# Patient Record
Sex: Male | Born: 1955 | Race: White | Hispanic: No | Smoking: Former smoker
Health system: Southern US, Community
[De-identification: ages and names within clinical notes are randomized; demographics above are authoritative.]

## PROBLEM LIST (undated history)

## (undated) DIAGNOSIS — M1990 Unspecified osteoarthritis, unspecified site: Secondary | ICD-10-CM

## (undated) DIAGNOSIS — F329 Major depressive disorder, single episode, unspecified: Secondary | ICD-10-CM

## (undated) DIAGNOSIS — N400 Enlarged prostate without lower urinary tract symptoms: Secondary | ICD-10-CM

## (undated) DIAGNOSIS — F419 Anxiety disorder, unspecified: Secondary | ICD-10-CM

## (undated) DIAGNOSIS — F32A Depression, unspecified: Secondary | ICD-10-CM

## (undated) DIAGNOSIS — N433 Hydrocele, unspecified: Secondary | ICD-10-CM

## (undated) DIAGNOSIS — F4321 Adjustment disorder with depressed mood: Secondary | ICD-10-CM

## (undated) DIAGNOSIS — I10 Essential (primary) hypertension: Secondary | ICD-10-CM

## (undated) DIAGNOSIS — K5730 Diverticulosis of large intestine without perforation or abscess without bleeding: Secondary | ICD-10-CM

## (undated) HISTORY — DX: Benign prostatic hyperplasia without lower urinary tract symptoms: N40.0

## (undated) HISTORY — DX: Depression, unspecified: F32.A

## (undated) HISTORY — DX: Diverticulosis of large intestine without perforation or abscess without bleeding: K57.30

## (undated) HISTORY — DX: Adjustment disorder with depressed mood: F43.21

## (undated) HISTORY — DX: Major depressive disorder, single episode, unspecified: F32.9

## (undated) HISTORY — DX: Essential (primary) hypertension: I10

## (undated) HISTORY — DX: Anxiety disorder, unspecified: F41.9

---

## 1976-03-18 HISTORY — PX: HERNIA REPAIR: SHX51

## 1999-10-26 ENCOUNTER — Ambulatory Visit (HOSPITAL_COMMUNITY): Admission: RE | Admit: 1999-10-26 | Discharge: 1999-10-26 | Payer: Self-pay | Admitting: Internal Medicine

## 1999-10-26 ENCOUNTER — Encounter: Payer: Self-pay | Admitting: Internal Medicine

## 2001-07-24 ENCOUNTER — Emergency Department (HOSPITAL_COMMUNITY): Admission: EM | Admit: 2001-07-24 | Discharge: 2001-07-24 | Payer: Self-pay | Admitting: Emergency Medicine

## 2001-07-26 ENCOUNTER — Emergency Department (HOSPITAL_COMMUNITY): Admission: EM | Admit: 2001-07-26 | Discharge: 2001-07-26 | Payer: Self-pay

## 2006-05-16 ENCOUNTER — Ambulatory Visit: Payer: Self-pay | Admitting: Internal Medicine

## 2006-05-16 LAB — CONVERTED CEMR LAB
ALT: 22 units/L (ref 0–40)
AST: 20 units/L (ref 0–37)
Albumin: 4.5 g/dL (ref 3.5–5.2)
Alkaline Phosphatase: 70 units/L (ref 39–117)
BUN: 14 mg/dL (ref 6–23)
Basophils Absolute: 0 10*3/uL (ref 0.0–0.1)
Basophils Relative: 0.6 % (ref 0.0–1.0)
Bilirubin Urine: NEGATIVE
Bilirubin, Direct: 0.1 mg/dL (ref 0.0–0.3)
CO2: 26 meq/L (ref 19–32)
Calcium: 9 mg/dL (ref 8.4–10.5)
Chloride: 103 meq/L (ref 96–112)
Cholesterol: 192 mg/dL (ref 0–200)
Creatinine, Ser: 0.6 mg/dL (ref 0.4–1.5)
Eosinophils Absolute: 0 10*3/uL (ref 0.0–0.6)
Eosinophils Relative: 0.6 % (ref 0.0–5.0)
GFR calc Af Amer: 183 mL/min
GFR calc non Af Amer: 152 mL/min
Glucose, Bld: 97 mg/dL (ref 70–99)
HCT: 43.9 % (ref 39.0–52.0)
HDL: 46.1 mg/dL (ref >39.0)
Hemoglobin, Urine: NEGATIVE
Hemoglobin: 15.3 g/dL (ref 13.0–17.0)
Ketones, ur: NEGATIVE mg/dL
LDL Cholesterol: 108 mg/dL — ABNORMAL HIGH (ref 0–99)
Leukocytes, UA: NEGATIVE
Lymphocytes Relative: 23.7 % (ref 12.0–46.0)
MCHC: 34.8 g/dL (ref 30.0–36.0)
MCV: 91.5 fL (ref 78.0–100.0)
Monocytes Absolute: 0.4 10*3/uL (ref 0.2–0.7)
Monocytes Relative: 6.6 % (ref 3.0–11.0)
Neutro Abs: 4.3 10*3/uL (ref 1.4–7.7)
Neutrophils Relative %: 68.5 % (ref 43.0–77.0)
Nitrite: NEGATIVE
PSA: 0.84 ng/mL (ref 0.10–4.00)
Platelets: 243 10*3/uL (ref 150–400)
Potassium: 3.9 meq/L (ref 3.5–5.1)
RBC: 4.79 M/uL (ref 4.22–5.81)
RDW: 11.6 % (ref 11.5–14.6)
Sodium: 134 meq/L — ABNORMAL LOW (ref 135–145)
Specific Gravity, Urine: 1.015 (ref 1.000–1.03)
TSH: 1.43 microintl units/mL (ref 0.35–5.50)
Total Bilirubin: 0.7 mg/dL (ref 0.3–1.2)
Total CHOL/HDL Ratio: 4.2
Total Protein, Urine: NEGATIVE mg/dL
Total Protein: 6.7 g/dL (ref 6.0–8.3)
Triglycerides: 187 mg/dL — ABNORMAL HIGH (ref 0–149)
Urine Glucose: NEGATIVE mg/dL
Urobilinogen, UA: 0.2 (ref 0.0–1.0)
VLDL: 37 mg/dL (ref 0–40)
WBC: 6.1 10*3/uL (ref 4.5–10.5)
pH: 7 (ref 5.0–8.0)

## 2006-05-20 ENCOUNTER — Ambulatory Visit: Payer: Self-pay | Admitting: Internal Medicine

## 2006-06-27 ENCOUNTER — Ambulatory Visit: Payer: Self-pay | Admitting: Gastroenterology

## 2006-07-09 ENCOUNTER — Ambulatory Visit: Payer: Self-pay | Admitting: Gastroenterology

## 2006-07-09 LAB — HM COLONOSCOPY

## 2006-11-27 ENCOUNTER — Encounter: Payer: Self-pay | Admitting: Internal Medicine

## 2007-04-09 ENCOUNTER — Emergency Department (HOSPITAL_COMMUNITY): Admission: EM | Admit: 2007-04-09 | Discharge: 2007-04-09 | Payer: Self-pay | Admitting: Emergency Medicine

## 2009-03-06 ENCOUNTER — Ambulatory Visit: Payer: Self-pay | Admitting: Internal Medicine

## 2009-03-06 DIAGNOSIS — R0789 Other chest pain: Secondary | ICD-10-CM | POA: Insufficient documentation

## 2009-03-06 DIAGNOSIS — K5730 Diverticulosis of large intestine without perforation or abscess without bleeding: Secondary | ICD-10-CM | POA: Insufficient documentation

## 2009-03-06 DIAGNOSIS — M722 Plantar fascial fibromatosis: Secondary | ICD-10-CM | POA: Insufficient documentation

## 2009-03-06 DIAGNOSIS — Z87891 Personal history of nicotine dependence: Secondary | ICD-10-CM | POA: Insufficient documentation

## 2009-03-14 ENCOUNTER — Telehealth (INDEPENDENT_AMBULATORY_CARE_PROVIDER_SITE_OTHER): Payer: Self-pay | Admitting: *Deleted

## 2009-03-15 ENCOUNTER — Ambulatory Visit: Payer: Self-pay | Admitting: Cardiovascular Disease

## 2009-03-15 ENCOUNTER — Ambulatory Visit: Payer: Self-pay

## 2009-03-15 ENCOUNTER — Encounter (HOSPITAL_COMMUNITY): Admission: RE | Admit: 2009-03-15 | Discharge: 2009-05-22 | Payer: Self-pay | Admitting: Internal Medicine

## 2009-03-18 DIAGNOSIS — I10 Essential (primary) hypertension: Secondary | ICD-10-CM

## 2009-03-18 HISTORY — DX: Essential (primary) hypertension: I10

## 2009-05-02 ENCOUNTER — Ambulatory Visit: Payer: Self-pay | Admitting: Internal Medicine

## 2009-05-02 DIAGNOSIS — N410 Acute prostatitis: Secondary | ICD-10-CM | POA: Insufficient documentation

## 2009-05-02 DIAGNOSIS — R109 Unspecified abdominal pain: Secondary | ICD-10-CM | POA: Insufficient documentation

## 2009-05-02 LAB — CONVERTED CEMR LAB
ALT: 28 units/L (ref 0–53)
AST: 23 units/L (ref 0–37)
Albumin: 4.9 g/dL (ref 3.5–5.2)
Alkaline Phosphatase: 66 units/L (ref 39–117)
BUN: 10 mg/dL (ref 6–23)
Basophils Absolute: 0 10*3/uL (ref 0.0–0.1)
Basophils Relative: 0.8 % (ref 0.0–3.0)
Bilirubin Urine: NEGATIVE
Bilirubin, Direct: 0.2 mg/dL (ref 0.0–0.3)
CO2: 29 meq/L (ref 19–32)
Calcium: 9.7 mg/dL (ref 8.4–10.5)
Chloride: 106 meq/L (ref 96–112)
Creatinine, Ser: 0.8 mg/dL (ref 0.4–1.5)
Eosinophils Absolute: 0 10*3/uL (ref 0.0–0.7)
Eosinophils Relative: 0.8 % (ref 0.0–5.0)
GFR calc non Af Amer: 107.45 mL/min (ref >60)
Glucose, Bld: 101 mg/dL — ABNORMAL HIGH (ref 70–99)
HCT: 45.2 % (ref 39.0–52.0)
Hemoglobin, Urine: NEGATIVE
Hemoglobin: 15.5 g/dL (ref 13.0–17.0)
Ketones, ur: NEGATIVE mg/dL
Leukocytes, UA: NEGATIVE
Lymphocytes Relative: 22.1 % (ref 12.0–46.0)
Lymphs Abs: 1.1 10*3/uL (ref 0.7–4.0)
MCHC: 34.2 g/dL (ref 30.0–36.0)
MCV: 94.4 fL (ref 78.0–100.0)
Monocytes Absolute: 0.4 10*3/uL (ref 0.1–1.0)
Monocytes Relative: 7 % (ref 3.0–12.0)
Neutro Abs: 3.6 10*3/uL (ref 1.4–7.7)
Neutrophils Relative %: 69.3 % (ref 43.0–77.0)
Nitrite: NEGATIVE
PSA: 1.07 ng/mL (ref 0.10–4.00)
Platelets: 225 10*3/uL (ref 150.0–400.0)
Potassium: 4.4 meq/L (ref 3.5–5.1)
RBC: 4.79 M/uL (ref 4.22–5.81)
RDW: 11.7 % (ref 11.5–14.6)
Sodium: 140 meq/L (ref 135–145)
Specific Gravity, Urine: 1.01 (ref 1.000–1.030)
Total Bilirubin: 0.6 mg/dL (ref 0.3–1.2)
Total Protein, Urine: NEGATIVE mg/dL
Total Protein: 7.4 g/dL (ref 6.0–8.3)
Urine Glucose: NEGATIVE mg/dL
Urobilinogen, UA: 0.2 (ref 0.0–1.0)
WBC: 5.1 10*3/uL (ref 4.5–10.5)
pH: 7 (ref 5.0–8.0)

## 2009-05-05 ENCOUNTER — Telehealth: Payer: Self-pay | Admitting: Internal Medicine

## 2009-05-23 ENCOUNTER — Ambulatory Visit: Payer: Self-pay | Admitting: Internal Medicine

## 2009-05-23 DIAGNOSIS — F411 Generalized anxiety disorder: Secondary | ICD-10-CM | POA: Insufficient documentation

## 2009-05-23 DIAGNOSIS — F329 Major depressive disorder, single episode, unspecified: Secondary | ICD-10-CM | POA: Insufficient documentation

## 2009-06-02 ENCOUNTER — Ambulatory Visit: Payer: Self-pay | Admitting: Internal Medicine

## 2009-06-05 ENCOUNTER — Telehealth (INDEPENDENT_AMBULATORY_CARE_PROVIDER_SITE_OTHER): Payer: Self-pay | Admitting: *Deleted

## 2009-06-09 ENCOUNTER — Ambulatory Visit: Payer: Self-pay | Admitting: Psychology

## 2009-06-12 ENCOUNTER — Ambulatory Visit: Payer: Self-pay | Admitting: Internal Medicine

## 2009-06-15 ENCOUNTER — Ambulatory Visit: Payer: Self-pay | Admitting: Psychology

## 2009-06-29 ENCOUNTER — Ambulatory Visit: Payer: Self-pay | Admitting: Psychology

## 2009-07-24 ENCOUNTER — Ambulatory Visit: Payer: Self-pay | Admitting: Psychology

## 2009-08-28 ENCOUNTER — Telehealth: Payer: Self-pay | Admitting: Internal Medicine

## 2009-10-24 ENCOUNTER — Telehealth: Payer: Self-pay | Admitting: Internal Medicine

## 2009-10-31 ENCOUNTER — Telehealth (INDEPENDENT_AMBULATORY_CARE_PROVIDER_SITE_OTHER): Payer: Self-pay | Admitting: *Deleted

## 2009-11-14 ENCOUNTER — Ambulatory Visit: Payer: Self-pay | Admitting: Internal Medicine

## 2009-11-14 DIAGNOSIS — G4700 Insomnia, unspecified: Secondary | ICD-10-CM | POA: Insufficient documentation

## 2009-11-29 ENCOUNTER — Telehealth: Payer: Self-pay | Admitting: Internal Medicine

## 2009-11-30 ENCOUNTER — Ambulatory Visit: Payer: Self-pay | Admitting: Internal Medicine

## 2009-11-30 DIAGNOSIS — I10 Essential (primary) hypertension: Secondary | ICD-10-CM | POA: Insufficient documentation

## 2009-12-20 ENCOUNTER — Ambulatory Visit: Payer: Self-pay | Admitting: Internal Medicine

## 2009-12-25 ENCOUNTER — Ambulatory Visit: Payer: Self-pay | Admitting: Psychology

## 2009-12-29 ENCOUNTER — Ambulatory Visit: Payer: Self-pay | Admitting: Internal Medicine

## 2010-01-04 ENCOUNTER — Telehealth: Payer: Self-pay | Admitting: Internal Medicine

## 2010-01-08 ENCOUNTER — Ambulatory Visit: Payer: Self-pay | Admitting: Internal Medicine

## 2010-01-11 ENCOUNTER — Ambulatory Visit: Payer: Self-pay | Admitting: Psychology

## 2010-01-15 ENCOUNTER — Telehealth: Payer: Self-pay | Admitting: Internal Medicine

## 2010-01-16 ENCOUNTER — Ambulatory Visit: Payer: Self-pay | Admitting: Internal Medicine

## 2010-01-23 ENCOUNTER — Telehealth: Payer: Self-pay | Admitting: Internal Medicine

## 2010-01-23 ENCOUNTER — Telehealth (INDEPENDENT_AMBULATORY_CARE_PROVIDER_SITE_OTHER): Payer: Self-pay | Admitting: *Deleted

## 2010-01-25 ENCOUNTER — Ambulatory Visit: Payer: Self-pay | Admitting: Internal Medicine

## 2010-01-29 ENCOUNTER — Telehealth (INDEPENDENT_AMBULATORY_CARE_PROVIDER_SITE_OTHER): Payer: Self-pay | Admitting: *Deleted

## 2010-02-06 ENCOUNTER — Ambulatory Visit: Payer: Self-pay | Admitting: Psychology

## 2010-02-07 ENCOUNTER — Ambulatory Visit: Payer: Self-pay | Admitting: Internal Medicine

## 2010-02-07 ENCOUNTER — Ambulatory Visit: Payer: Self-pay | Admitting: Psychology

## 2010-02-12 ENCOUNTER — Telehealth: Payer: Self-pay | Admitting: Internal Medicine

## 2010-02-13 ENCOUNTER — Ambulatory Visit: Payer: Self-pay | Admitting: Psychology

## 2010-02-15 ENCOUNTER — Ambulatory Visit: Payer: Self-pay | Admitting: Internal Medicine

## 2010-02-16 ENCOUNTER — Encounter: Payer: Self-pay | Admitting: Internal Medicine

## 2010-03-01 ENCOUNTER — Ambulatory Visit: Payer: Self-pay | Admitting: Psychology

## 2010-03-02 ENCOUNTER — Ambulatory Visit: Payer: Self-pay | Admitting: Internal Medicine

## 2010-03-16 ENCOUNTER — Ambulatory Visit: Payer: Self-pay | Admitting: Internal Medicine

## 2010-04-19 NOTE — Assessment & Plan Note (Signed)
Summary: 4 MO ROV /NWS  #   Vital Signs:  Patient profile:   55 year old male Height:      69 inches Weight:      207 pounds BMI:     30.68 Temp:     97.9 degrees F oral Pulse rate:   80 / minute Pulse rhythm:   regular Resp:     16 per minute BP sitting:   122 / 90  (left arm) Cuff size:   regular  Vitals Entered By: Lanier Prude, CMA(AAMA) (March 16, 2010 9:03 AM) CC: 4 mo f/u  c/o cough X 3 wks since starting Clonazepam Is Patient Diabetic? No Comments pt is not taking Lorazepam   Primary Care Provider:  Georgina Quint Plotnikov MD  CC:  4 mo f/u  c/o cough X 3 wks since starting Clonazepam.  History of Present Illness: The patient presents for a follow up of stress, anxiety, depression and HTN. He is beeing interviewed and having background check with another company.  Current Medications (verified): 1)  Bayer Low Strength 81 Mg Tbec (Aspirin) .... Take 1 By Mouth Qd 2)  Vitamin C 1000 Mg Tabs (Ascorbic Acid) .... Take 1 By Mouth Qd 3)  Fish Oil 1000 Mg Caps (Omega-3 Fatty Acids) .... Take 1 By Mouth Qd 4)  Lorazepam 0.5 Mg Tabs (Lorazepam) .Marland Kitchen.. 1-2 By Mouth Two Times A Day As Needed For Anxiety or Insmnia 5)  Trazodone Hcl 50 Mg Tabs (Trazodone Hcl) .Marland Kitchen.. 1 By Mouth Qhs 6)  Losartan Potassium 100 Mg Tabs (Losartan Potassium) .Marland Kitchen.. 1 By Mouth Once Daily For Blood Pressure 7)  Paroxetine Hcl 20 Mg Tabs (Paroxetine Hcl) .... 2 By Mouth Qd 8)  Multivitamins  Tabs (Multiple Vitamin) .... Take 1 By Mouth Qd 9)  Norvasc 5 Mg Tabs (Amlodipine Besylate) .Marland Kitchen.. 1 By Mouth Once Daily 10)  Clonazepam 0.5 Mg Tabs (Clonazepam) .Marland Kitchen.. 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    Physical Exam  General:  Tense , alert, well-developed, well-nourished, and cooperative to examination.    Head:  Normocephalic and atraumatic without obvious abnormalities. No apparent alopecia or balding. Nose:  External nasal examination shows no deformity or inflammation. Nasal mucosa are pink and moist without lesions or exudates. Mouth:  Oral mucosa and oropharynx without lesions or exudates.  Teeth in good repair. Neck:  No deformities, masses, or tenderness noted. Lungs:  Normal respiratory effort, chest expands symmetrically. Lungs are clear to auscultation, no crackles or wheezes. Heart:  Normal rate and regular rhythm. S1 and S2 normal without gallop, murmur, click, rub or other extra sounds. Abdomen:  soft, non-tender, normal bowel sounds, no distention, no masses, no guarding, no rigidity, no rebound tenderness, no abdominal hernia, no inguinal hernia, no hepatomegaly, and no splenomegaly.   Msk:  No deformity or scoliosis noted of thoracic or lumbar spine.   Neurologic:  No cranial nerve deficits noted. Station and gait are normal. Plantar reflexes are down-going bilaterally. DTRs are symmetrical throughout. Sensory, motor and  coordinative functions appear intact. Skin:  Intact without suspicious lesions or rashes Cervical Nodes:  No lymphadenopathy noted Psych:  not suicidal, not homicidal, less dysphoric affect, depressed affect,  anxious.     Impression & Recommendations:  Problem # 1:  DEPRESSION (ICD-311) Assessment Improved He will see his psychiatrist next Wed and he will release him to work if appropriate ([employer] return to work date - next Thursday). His updated medication list for this problem includes:    Lorazepam 0.5 Mg Tabs (Lorazepam) .Marland Kitchen... 1-2 by mouth two times  a day as needed for anxiety or insmnia    Trazodone Hcl 50 Mg Tabs (Trazodone hcl) .Marland Kitchen... 1 by mouth qhs    Paroxetine Hcl 20 Mg Tabs (Paroxetine hcl) .Marland Kitchen... 2 by mouth qd    Clonazepam 0.5 Mg Tabs (Clonazepam) .Marland Kitchen... 1 by mouth every morning and 2 by mouth at bedtime  Problem # 2:  INSOMNIA, CHRONIC (ICD-307.42) Assessment: Improved On the regimen of medicine(s) reflected in the chart    Problem # 3:  ANXIETY (ICD-300.00) Assessment: Improved  His updated medication list for this problem includes:    Lorazepam 0.5 Mg Tabs (Lorazepam) .Marland Kitchen... 1-2 by mouth two times a day as needed for anxiety or insmnia    Trazodone Hcl 50 Mg Tabs (Trazodone hcl) .Marland Kitchen... 1 by mouth qhs    Paroxetine Hcl 20 Mg Tabs (Paroxetine hcl) .Marland Kitchen... 2 by mouth qd    Clonazepam 0.5 Mg Tabs (Clonazepam) .Marland Kitchen... 1 by mouth every morning and 2 by mouth at bedtime  Problem # 4:  HYPERTENSION (ICD-401.9) Assessment: Unchanged  His updated medication list for this problem includes:    Losartan Potassium 100 Mg Tabs (Losartan potassium) .Marland Kitchen... 1 by mouth once daily for blood pressure    Norvasc 5 Mg Tabs (Amlodipine besylate) .Marland Kitchen... 1 by mouth once daily  BP today: 122/90 Prior BP: 122/82 (02/15/2010)  Labs Reviewed: K+: 4.4 (05/02/2009) Creat: : 0.8 (05/02/2009)   Chol: 192 (05/16/2006)   HDL: 46.1 (05/16/2006)   LDL: 108 (05/16/2006)   TG: 187 (05/16/2006)  Complete Medication List: 1)  Bayer Low Strength 81 Mg Tbec (Aspirin) .... Take 1 by mouth qd 2)  Vitamin C 1000 Mg Tabs (Ascorbic acid) .... Take 1 by mouth qd 3)  Fish Oil 1000 Mg Caps (Omega-3 fatty acids) .... Take 1 by mouth qd 4)  Lorazepam 0.5 Mg Tabs (Lorazepam) .Marland Kitchen.. 1-2 by mouth two times a day as needed for anxiety or insmnia 5)  Trazodone Hcl 50 Mg Tabs (Trazodone hcl) .Marland Kitchen.. 1 by mouth qhs 6)  Losartan Potassium 100 Mg Tabs (Losartan potassium) .Marland Kitchen.. 1 by mouth once daily for blood pressure 7)  Paroxetine Hcl 20 Mg Tabs (Paroxetine hcl) .... 2 by mouth qd 8)   Multivitamins Tabs (Multiple vitamin) .... Take 1 by mouth qd 9)  Norvasc 5 Mg Tabs (Amlodipine besylate) .Marland Kitchen.. 1 by mouth once daily 10)  Clonazepam 0.5 Mg Tabs (Clonazepam) .Marland Kitchen.. 1 by mouth every morning and 2 by mouth at bedtime  Patient Instructions: 1)  Please schedule a follow-up appointment in 3 months.   Orders Added: 1)  Est. Patient Level IV [[address]]

## 2010-04-19 NOTE — Assessment & Plan Note (Signed)
Summary: elevated bp/SD   Vital Signs:  Patient profile:   55 year old male Height:      69 inches Weight:      191 pounds BMI:     28.31 Temp:     97.7 degrees F oral Pulse rate:   84 / minute Pulse rhythm:   regular Resp:     16 per minute BP sitting:   160 / 108  (left arm) Cuff size:   regular  Vitals Entered By: Lanier Prude, CMA(AAMA) (November 30, 2009 8:29 AM) CC: elevated blood pressure Is Patient Diabetic? No   Primary Care Provider:  Georgina Quint Plotnikov MD  CC:  elevated blood pressure.  History of Present Illness: On 9/14 pt woke up and didn't feel quite right, no specific complaints. BP 175/130 per home machine. Later this afternoon BP was 155/110. Again, so specific complaints. He is scheduled for office visit for eval. He put new batteries in BP machine and rechecked: BP was 180/104 HR 107.    Current Medications (verified): 1)  Bayer Low Strength 81 Mg Tbec (Aspirin) .... Take 1 By Mouth Qd 2)  Vitamin C 1000 Mg Tabs (Ascorbic Acid) .... Take 1 By Mouth Qd 3)  Fish Oil 1000 Mg Caps (Omega-3 Fatty Acids) .... Take 1 By Mouth Qd 4)  Multivitamins  Tabs (Multiple Vitamin) .... Take 1 By Mouth Qd 5)  Lorazepam 0.5 Mg Tabs (Lorazepam) .Marland Kitchen.. 1 By Mouth Two Times A Day As Needed For Anxiety or Insmnia 6)  Paroxetine Hcl 10 Mg Tabs (Paroxetine Hcl) .Marland Kitchen.. 1 By Mouth Qd 7)  Trazodone Hcl 50 Mg Tabs (Trazodone Hcl) .Marland Kitchen.. 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    Physical Exam  General:  Tense ,alert, well-developed, well-nourished, and cooperative to examination.    Nose:  External nasal examination shows no deformity or inflammation. Nasal mucosa are pink and moist without lesions or exudates. Mouth:  Oral mucosa and oropharynx without lesions or exudates.  Teeth in good repair. Neck:  No deformities, masses, or tenderness noted. Lungs:  Normal respiratory effort, chest expands symmetrically. Lungs are clear to auscultation, no crackles or wheezes. Heart:  Normal rate and regular rhythm. S1 and S2 normal without gallop, murmur, click, rub or other extra sounds. Abdomen:  soft, non-tender, normal bowel sounds, no distention, no masses, no guarding, no rigidity, no rebound tenderness, no abdominal hernia, no inguinal hernia, no hepatomegaly, and no splenomegaly.   Msk:  No deformity or scoliosis noted of thoracic or lumbar spine.   Neurologic:  No cranial nerve deficits noted. Station and gait are normal. Plantar reflexes are down-going bilaterally. DTRs are symmetrical throughout. Sensory, motor and coordinative functions  appear intact. Skin:  Intact without suspicious lesions or rashes Psych:  not suicidal, not homicidal, dysphoric affect, less depressed affect, and not anxious.     Impression & Recommendations:  Problem # 1:  HYPERTENSION (ICD-401.9) Assessment New  His updated medication list for this problem includes:    Losartan Potassium 100 Mg Tabs (Losartan potassium) .Marland Kitchen... 1 by mouth once daily for blood pressure  Problem # 2:  DEPRESSION  (ICD-311) Assessment: Unchanged Overall better... His updated medication list for this problem includes:    Lorazepam 0.5 Mg Tabs (Lorazepam) .Marland Kitchen... 1 by mouth two times a day as needed for anxiety or insmnia    Paroxetine Hcl 10 Mg Tabs (Paroxetine hcl) .Marland Kitchen... 1 by mouth qd    Trazodone Hcl 50 Mg Tabs (Trazodone hcl) .Marland Kitchen... 1 by mouth qhs  Complete Medication List: 1)  Bayer Low Strength 81 Mg Tbec (Aspirin) .... Take 1 by mouth qd 2)  Vitamin C 1000 Mg Tabs (Ascorbic acid) .... Take 1 by mouth qd 3)  Fish Oil 1000 Mg Caps (Omega-3 fatty acids) .... Take 1 by mouth qd 4)  Multivitamins Tabs (Multiple vitamin) .... Take 1 by mouth qd 5)  Lorazepam 0.5 Mg Tabs (Lorazepam) .Marland Kitchen.. 1 by mouth two times a day as needed for anxiety or insmnia 6)  Paroxetine Hcl 10 Mg Tabs (Paroxetine hcl) .Marland Kitchen.. 1 by mouth qd 7)  Trazodone Hcl 50 Mg Tabs (Trazodone hcl) .Marland Kitchen.. 1 by mouth qhs 8)  Losartan Potassium 100 Mg Tabs (Losartan potassium) .Marland Kitchen.. 1 by mouth once daily for blood pressure  Other Orders: Admin 1st Vaccine ([address]) Flu Vaccine 11yrs + ([address])  Patient Instructions: 1)  Keep return office visit  Prescriptions: LOSARTAN POTASSIUM 100 MG TABS (LOSARTAN POTASSIUM) 1 by mouth once daily for blood pressure  #30 x 12   Entered and Authorized by:   Tresa Garter MD   Signed by:   Tresa Garter MD on 11/30/2009   Method used:   Electronically to        [facility]  [address] [id number] ([facility]ail[facility]       [address]       [place], [place]  [address]       Ph: [phone] or [phone]       Fax: [phone]   RxID:   [id number] LOSARTAN POTASSIUM 100 MG TABS (LOSARTAN POTASSIUM) 1 by mouth once daily for blood pressure  #30 x 12   Entered and Authorized by:   Tresa Garter MD   Signed by:   Tresa Garter MD on 11/30/2009   Method used:   Print then Give to Patient   RxID:   [id number]   .lbflu   Flu Vaccine Consent Questions     Do you have a history  of severe allergic reactions to this vaccine? no    Any prior history of allergic reactions to egg and/or gelatin? no    Do you have a sensitivity to the preservative Thimersol? no    Do you have a past history of Guillan-Barre Syndrome? no    Do you currently have an acute febrile illness? no    Have you ever had a severe reaction to latex? no    Vaccine information given and explained to patient? yes    Are you currently pregnant? no    Lot Number:[id number]   Exp Date:09/15/2010   Site Given  Left Deltoid IM Lanier Prude, [facility])  November 30, 2009 8:54 AM

## 2010-04-19 NOTE — Letter (Signed)
Summary: Results Follow-up Letter  [facility]  [address] [place], [place] [address]   Phone: [phone]  Fax: [phone]    05/02/2009  [address] [place], [place]  [address]  Dear Mr. Verner,   The following are the results of your recent test(s):  Test     Result     Liver/kidney   normal CBC       normal Prostate     normal Urine       normal  _________________________________________________________  Please call for an appointment as directed _________________________________________________________ _________________________________________________________ _________________________________________________________  Sincerely,  Sanda Linger MD [facility]

## 2010-04-19 NOTE — Assessment & Plan Note (Signed)
Summary: LOWER COLON PAIN AND PRESSURE/NWS   Vital Signs:  Patient profile:   55 year old male Height:      69 inches Weight:      188 pounds O2 Sat:      96 % on Room air Temp:     98.4 degrees F oral Pulse rate:   73 / minute Pulse rhythm:   regular Resp:     16 per minute BP sitting:   142 / 88  (left arm) Cuff size:   large  Vitals Entered By: Rock Nephew CMA (May 02, 2009 9:53 AM)  O2 Flow:  Room air CC: colon pain x 04/29/09, pain has progressed and think hernia Is Patient Diabetic? No   Primary Care Provider:  Georgina Quint Plotnikov MD  CC:  colon pain x 04/29/09 and pain has progressed and think hernia.  History of Present Illness: New to me he complains of a 4 day hx. of perineal pain.  Preventive Screening-Counseling & Management  Alcohol-Tobacco     Alcohol drinks/day: 0     Smoking Status: quit     Year Quit: 1999  Caffeine-Diet-Exercise     Does Patient Exercise: yes  Hep-HIV-STD-Contraception     Hepatitis Risk: no risk noted     HIV Risk: no risk noted     STD Risk: no risk noted      Sexual History:  currently monogamous.        Drug Use:  no.        Blood Transfusions:  no.    Medications Prior to Update: 1)  Ec-Naprosyn 500 Mg  Tbec (Naproxen) .... Once Daily 2)  Bayer Low Strength 81 Mg Tbec (Aspirin) .... Take 1 By Mouth Qd 3)  Vitamin C 1000 Mg Tabs (Ascorbic Acid) .... Take 1 By Mouth Qd 4)  Fish Oil 1000 Mg Caps (Omega-3 Fatty Acids) .... Take 1 By Mouth Qd 5)  Multivitamins  Tabs (Multiple Vitamin) .... Take 1 By Mouth Qd  Current Medications (verified): 1)  Ec-Naprosyn 500 Mg  Tbec (Naproxen) .... Once Daily 2)  Bayer Low Strength 81 Mg Tbec (Aspirin) .... Take 1 By Mouth Qd 3)  Vitamin C 1000 Mg Tabs (Ascorbic Acid) .... Take 1 By Mouth Qd 4)  Fish Oil 1000 Mg Caps (Omega-3 Fatty Acids) .... Take 1 By Mouth Qd 5)  Multivitamins  Tabs (Multiple Vitamin) .... Take 1 By Mouth Qd  Allergies (verified): No Known Drug  Allergies  Past History:  Past Medical History: Reviewed history from 03/06/2009 and no changes required. Diverticulosis, colon  Past Surgical History: Denies surgical history  Family History: Reviewed history from 03/06/2009 and no changes required.  father had coronary disease ?CABG - expired age 59y no premature death <50y in relatives  Social History: Reviewed history from 03/06/2009 and no changes required. Former Smoker - quit 1990s min second hand smoke exposure (wife) married, lives with wife Alcohol use-no Drug use-no Regular exercise-yes Hepatitis Risk:  no risk noted HIV Risk:  no risk noted STD Risk:  no risk noted Sexual History:  currently monogamous Blood Transfusions:  no Drug Use:  no Does Patient Exercise:  yes  Review of Systems  The patient denies anorexia, fever, chest pain, melena, hematochezia, severe indigestion/heartburn, hematuria, enlarged lymph nodes, and testicular masses.   General:  Denies chills, fatigue, fever, loss of appetite, malaise, sweats, and weakness. GI:  Denies abdominal pain, bloody stools, change in bowel habits, constipation, dark tarry stools, diarrhea, indigestion, loss of  appetite, nausea, vomiting, and vomiting blood. GU:  Denies discharge, dysuria, genital sores, hematuria, incontinence, nocturia, urinary frequency, and urinary hesitancy.  Physical Exam  General:  alert, well-developed, well-nourished, and cooperative to examination.    Head:  normocephalic, atraumatic, no abnormalities observed, and no abnormalities palpated.   Mouth:  Oral mucosa and oropharynx without lesions or exudates.  Teeth in good repair. Neck:  supple, full ROM, no masses, no thyromegaly, no JVD, normal carotid upstroke, and no carotid bruits.   Lungs:  Normal respiratory effort, chest expands symmetrically. Lungs are clear to auscultation, no crackles or wheezes. Heart:  Normal rate and regular rhythm. S1 and S2 normal without gallop, murmur,  click, rub or other extra sounds. Abdomen:  soft, non-tender, normal bowel sounds, no distention, no masses, no guarding, no rigidity, no rebound tenderness, no abdominal hernia, no inguinal hernia, no hepatomegaly, and no splenomegaly.   Rectal:  No external abnormalities noted. Normal sphincter tone. No rectal masses or tenderness. heme negative stool. Genitalia:  circumcised, no hydrocele, no varicocele, no scrotal masses, no cutaneous lesions, no urethral discharge, and L testes atrophic.   Prostate:  boggy, 2+ enlarged, and L asymmetrical enlargement.   Msk:  No deformity or scoliosis noted of thoracic or lumbar spine.   Pulses:  R and L carotid,radial,femoral,dorsalis pedis and posterior tibial pulses are full and equal bilaterally Extremities:  No clubbing, cyanosis, edema, or deformity noted with normal full range of motion of all joints.   Neurologic:  No cranial nerve deficits noted. Station and gait are normal. Plantar reflexes are down-going bilaterally. DTRs are symmetrical throughout. Sensory, motor and coordinative functions appear intact. Skin:  Intact without suspicious lesions or rashes Cervical Nodes:  No lymphadenopathy noted Axillary Nodes:  No palpable lymphadenopathy Inguinal Nodes:  No significant adenopathy Psych:  Cognition and judgment appear intact. Alert and cooperative with normal attention span and concentration. No apparent delusions, illusions, hallucinations   Impression & Recommendations:  Problem # 1:  PROSTATITIS, ACUTE (ICD-601.0) Assessment New start bactrim Orders: Venipuncture (28413) TLB-BMP (Basic Metabolic Panel-BMET) (80048-METABOL) TLB-CBC Platelet - w/Differential (85025-CBCD) TLB-Hepatic/Liver Function Pnl (80076-HEPATIC) TLB-PSA (Prostate Specific Antigen) (84153-PSA) TLB-Udip w/ Micro (81001-URINE)  Problem # 2:  PELVIC PAIN, ACUTE (ICD-789.09) Assessment: New I think this is due to the prostatitis but will look for other systemic  causes. His updated medication list for this problem includes:    Ec-naprosyn 500 Mg Tbec (Naproxen) ..... Once daily    Bayer Low Strength 81 Mg Tbec (Aspirin) .Marland Kitchen... Take 1 by mouth qd  Orders: Venipuncture (24401) TLB-BMP (Basic Metabolic Panel-BMET) (80048-METABOL) TLB-CBC Platelet - w/Differential (85025-CBCD) TLB-Hepatic/Liver Function Pnl (80076-HEPATIC) TLB-PSA (Prostate Specific Antigen) (84153-PSA) TLB-Udip w/ Micro (81001-URINE) Hemoccult Guaiac-1 spec.(in office) (82270)  Discussed use of medications, application of heat or cold, and exercises.   Problem # 3:  DIVERTICULOSIS, COLON (ICD-562.10) Assessment: Improved  Orders: Hemoccult Guaiac-1 spec.(in office) (02725)  Colonoscopy:  Labs Reviewed: Hgb: 15.3 (05/16/2006)   Hct: 43.9 (05/16/2006)   WBC: 6.1 (05/16/2006)  Complete Medication List: 1)  Ec-naprosyn 500 Mg Tbec (Naproxen) .... Once daily 2)  Bayer Low Strength 81 Mg Tbec (Aspirin) .... Take 1 by mouth qd 3)  Vitamin C 1000 Mg Tabs (Ascorbic acid) .... Take 1 by mouth qd 4)  Fish Oil 1000 Mg Caps (Omega-3 fatty acids) .... Take 1 by mouth qd 5)  Multivitamins Tabs (Multiple vitamin) .... Take 1 by mouth qd 6)  Sulfamethoxazole-tmp Ds 800-160 Mg Tab (Trimethoprim-sulfamethoxazole) .... Take 1 tablet by mouth  morning and night  Patient Instructions: 1)  Please schedule a follow-up appointment in 2 weeks. 2)  Take your antibiotic as prescribed until ALL of it is gone, but stop if you develop a rash or swelling and contact our office as soon as possible. Prescriptions: SULFAMETHOXAZOLE-TMP DS 800-160 MG TAB (TRIMETHOPRIM-SULFAMETHOXAZOLE) Take 1 tablet by mouth morning and night  #60 x 1   Entered and Authorized by:   Etta Grandchild MD   Signed by:   Etta Grandchild MD on 05/02/2009   Method used:   [facility]  [address] [id number]       [address]       [place], [place]  [address]       Ph: [phone]  or [phone]       Fax: [phone]   RxID:   [id number]

## 2010-04-19 NOTE — Progress Notes (Signed)
----   Converted from flag ---- ---- 10/25/2009 10:09 AM, Verdell Face wrote: left message on vm  to cb to sched appt/cd    ---- 10/25/2009 9:59 AM, Lanier Prude, CMA(AAMA) wrote: Please sched pt for OV per AVP.   Thanks!!!! ------------------------------ Jovita Gamma pt appt:  phone---11/14/09 @ 930a w/Dr Plotnikov

## 2010-04-19 NOTE — Progress Notes (Signed)
Summary: RX QUESTION  Phone Note Call from Patient Call back at [phone]   Summary of Call: Pt called req a call back regarding s/e of medication. Called pt's home and spoke with wife. She says pt c/o cough x 2 days. I called pt on cell # provided by wife and left vm to return call.  Initial call taken by: Lamar Sprinkles, CMA,  May 05, 2009 10:04 AM  Follow-up for Phone Call        Bactrim should not have caused a cough. Pls check w/Dr Yetta Barre - he saw him on 2/15 Follow-up by: Tresa Garter MD,  May 05, 2009 1:42 PM  Additional Follow-up for Phone Call Additional follow up Details #1::        agree bactrim does not cause cough, he needs to be seen Additional Follow-up by: Etta Grandchild MD,  May 05, 2009 5:03 PM    Additional Follow-up for Phone Call Additional follow up Details #2::    Pt is concerned, says he highlighted the area on the paperwork that cough is one of the side effects.   1.Would Dr consider changing rx? No fever or sob. Reports some fatigue at the end of the day.   2. Also wants to know if prostatitis is common and if there is anything he can do to prevent it in the future?   .................Marland KitchenLamar Sprinkles, CMA  May 05, 2009 5:48 PM   Additional Follow-up for Phone Call Additional follow up Details #3:: Details for Additional Follow-up Action Taken: 1. see RX  2. yes prostatits is very common, no real prevention     left mess to call office back...............Marland KitchenLamar Sprinkles, CMA  May 08, 2009 12:18 PM  left mess to call office back......................Marland KitchenLamar Sprinkles, CMA  May 08, 2009 6:04 PM   left mess to call office back .................................Marland KitchenLamar Sprinkles, CMA  May 09, 2009 5:15 PM   No return call from patient, please advise. Lucious Groves  May 10, 2009 1:10 PM   New/Updated Medications: DOXYCYCLINE HYCLATE 100 MG TABS (DOXYCYCLINE HYCLATE) One by mouth two times a day for 30  days Prescriptions: DOXYCYCLINE HYCLATE 100 MG TABS (DOXYCYCLINE HYCLATE) One by mouth two times a day for 30 days  #60 x 1   Entered and Authorized by:   Etta Grandchild MD   Signed by:   Etta Grandchild MD on 05/08/2009   Method used:   [facility]  [address] [id number]       [address]       [place], [place]  [address]       Ph: [phone] or [phone]       Fax: [phone]   RxID:   [id number]

## 2010-04-19 NOTE — Assessment & Plan Note (Signed)
Summary: PER FLAG/STACEY--OV---PHONE--STC   Vital Signs:  Patient profile:   55 year old male Height:      69 inches (175.26 cm) Weight:      190.50 pounds (86.59 kg) BMI:     28.23 O2 Sat:      97 % on Room air Temp:     98.0 degrees F (36.67 degrees C) oral Pulse rate:   76 / minute BP sitting:   130 / 88  (left arm) Cuff size:   regular  Vitals Entered By: Lucious Groves CMA (November 14, 2009 9:29 AM)  O2 Flow:  Room air CC: follow-up visit, last seen 05/2009./kb Is Patient Diabetic? No Pain Assessment Patient in pain? no        Primary Care Sadee Osland:  Georgina Quint Plotnikov MD  CC:  follow-up visit and last seen 05/2009./kb.  History of Present Illness: The patient presents for a follow up of back pain, anxiety, depression and stress and insomnia. More stress at work - long hrs, extra work  Current Medications (verified): 1)  Ec-Naprosyn 500 Mg  Tbec (Naproxen) .... Once Daily 2)  Bayer Low Strength 81 Mg Tbec (Aspirin) .... Take 1 By Mouth Qd 3)  Vitamin C 1000 Mg Tabs (Ascorbic Acid) .... Take 1 By Mouth Qd 4)  Fish Oil 1000 Mg Caps (Omega-3 Fatty Acids) .... Take 1 By Mouth Qd 5)  Multivitamins  Tabs (Multiple Vitamin) .... Take 1 By Mouth Qd 6)  Doxycycline Hyclate 100 Mg Tabs (Doxycycline Hyclate) .... One By Mouth Two Times A Day For 30 Days 7)  Lorazepam 0.5 Mg Tabs (Lorazepam) .Marland Kitchen.. 1 By Mouth Two Times A Day As Needed For Anxiety or Insmnia 8)  Paroxetine Hcl 10 Mg Tabs (Paroxetine Hcl) .Marland Kitchen.. 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    Physical Exam  General:  Tense ,alert, well-developed, well-nourished, and cooperative to examination.    Nose:  External nasal examination shows no deformity or inflammation. Nasal mucosa are pink and moist without lesions or exudates. Mouth:  Oral mucosa and oropharynx without lesions or exudates.  Teeth in good repair. Lungs:  Normal respiratory effort, chest expands symmetrically. Lungs are clear to auscultation, no crackles or wheezes. Heart:  Normal rate and regular rhythm. S1 and S2 normal without gallop, murmur, click, rub or other extra sounds. Abdomen:  soft, non-tender, normal bowel sounds, no distention, no masses, no guarding, no rigidity, no rebound tenderness, no abdominal hernia, no inguinal hernia, no hepatomegaly, and no splenomegaly.   Msk:  No deformity or scoliosis noted of thoracic or lumbar spine.   Neurologic:  No cranial nerve deficits noted. Station and gait are normal. Plantar reflexes are down-going bilaterally. DTRs are symmetrical throughout. Sensory, motor and coordinative functions appear intact. Skin:  Intact without suspicious lesions or rashes Psych:  not suicidal, not homicidal, dysphoric affect, less depressed affect, and not anxious.     Impression & Recommendations:  Problem # 1:  DEPRESSION (ICD-311)  Assessment Unchanged  His updated medication list for this problem includes:    Lorazepam 0.5 Mg Tabs (Lorazepam) .Marland Kitchen... 1 by mouth two times a day as needed for anxiety or insmnia    Paroxetine Hcl 10 Mg Tabs (Paroxetine hcl) .Marland Kitchen... 1 by mouth qd    Trazodone Hcl 50 Mg Tabs (Trazodone hcl) .Marland Kitchen... 1 by mouth qhs  Problem # 2:  ANXIETY (ICD-300.00) Assessment: Unchanged  His updated medication list for this problem includes:    Lorazepam 0.5 Mg Tabs (Lorazepam) .Marland Kitchen... 1 by mouth two  times a day as needed for anxiety or insmnia    Paroxetine Hcl 10 Mg Tabs (Paroxetine hcl) .Marland Kitchen... 1 by mouth qd    Trazodone Hcl 50 Mg Tabs (Trazodone hcl) .Marland Kitchen... 1 by mouth qhs  Problem # 3:  INSOMNIA, CHRONIC (ICD-307.42) Assessment: New Try Trazodone  Complete Medication List: 1)  Bayer Low Strength 81 Mg Tbec (Aspirin) .... Take 1 by mouth qd 2)  Vitamin C 1000 Mg Tabs (Ascorbic acid) .... Take 1 by mouth qd 3)  Fish Oil 1000 Mg Caps (Omega-3 fatty acids) .... Take 1 by mouth qd 4)  Multivitamins Tabs (Multiple vitamin) .... Take 1 by mouth qd 5)  Lorazepam 0.5 Mg Tabs (Lorazepam) .Marland Kitchen.. 1 by mouth two times a day as needed for anxiety or insmnia 6)  Paroxetine Hcl 10 Mg Tabs (Paroxetine hcl) .Marland Kitchen.. 1 by mouth qd 7)  Trazodone Hcl 50 Mg Tabs (Trazodone hcl) .Marland Kitchen.. 1 by mouth qhs  Patient Instructions: 1)  Please schedule a follow-up appointment in 4 months well with labs. Prescriptions: PAROXETINE HCL 10 MG TABS (PAROXETINE HCL) 1 by mouth qd  #90 x 2   Entered and Authorized by:   Tresa Garter MD   Signed by:   Tresa Garter MD on 11/14/2009   Method used:   Print then Give to Patient   RxID:   [id number] LORAZEPAM 0.5 MG TABS (LORAZEPAM) 1 by mouth two times a day as needed for anxiety or insmnia  #60 x 1   Entered and Authorized by:   Tresa Garter MD   Signed by:   Tresa Garter MD on 11/14/2009   Method used:   Print then Give to Patient   RxID:   [id number] TRAZODONE HCL 50 MG TABS (TRAZODONE HCL) 1 by mouth qhs  #30 x 6   Entered and Authorized by:   Tresa Garter MD   Signed by:   Tresa Garter MD on 11/14/2009   Method used:   Print then Give to Patient   RxID:   [id number]

## 2010-04-19 NOTE — Assessment & Plan Note (Signed)
Summary: FU--STC   Vital Signs:  Patient profile:   55 year old male Weight:      190 pounds Temp:     96.7 degrees F oral Pulse rate:   70 / minute BP sitting:   110 / 70  (left arm)  Vitals Entered By: Tora Perches (June 12, 2009 1:38 PM) CC: f/u Is Patient Diabetic? No   Primary Care Provider:  Tresa Garter MD  CC:  f/u.  History of Present Illness: The patient presents for a follow up of stress, anxiety, depression. Feeling better.   Preventive Screening-Counseling & Management  Alcohol-Tobacco     Smoking Status: never  Current Medications (verified): 1)  Ec-Naprosyn 500 Mg  Tbec (Naproxen) .... Once Daily 2)  Bayer Low Strength 81 Mg Tbec (Aspirin) .... Take 1 By Mouth Qd 3)  Vitamin C 1000 Mg Tabs (Ascorbic Acid) .... Take 1 By Mouth Qd 4)  Fish Oil 1000 Mg Caps (Omega-3 Fatty Acids) .... Take 1 By Mouth Qd 5)  Multivitamins  Tabs (Multiple Vitamin) .... Take 1 By Mouth Qd 6)  Doxycycline Hyclate 100 Mg Tabs (Doxycycline Hyclate) .... One By Mouth Two Times A Day For 30 Days 7)  Lorazepam 0.5 Mg Tabs (Lorazepam) .Marland Kitchen.. 1 By Mouth Two Times A Day As Needed For Anxiety or Insmnia 8)  Paroxetine Hcl 10 Mg Tabs (Paroxetine Hcl) .Marland Kitchen.. 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    Physical Exam  General:  Tense,alert, well-developed, well-nourished, and cooperative to examination.    Mouth:  Oral mucosa and oropharynx without lesions or exudates.   Teeth in good repair. Lungs:  Normal respiratory effort, chest expands symmetrically. Lungs are clear to auscultation, no crackles or wheezes. Heart:  Normal rate and regular rhythm. S1 and S2 normal without gallop, murmur, click, rub or other extra sounds. Abdomen:  soft, non-tender, normal bowel sounds, no distention, no masses, no guarding, no rigidity, no rebound tenderness, no abdominal hernia, no inguinal hernia, no hepatomegaly, and no splenomegaly.   Msk:  No deformity or scoliosis noted of thoracic or lumbar spine.   Neurologic:  No cranial nerve deficits noted. Station and gait are normal. Plantar reflexes are down-going bilaterally. DTRs are symmetrical throughout. Sensory, motor and coordinative functions appear intact. Skin:  Intact without suspicious lesions or rashes Psych:  not suicidal, not homicidal, dysphoric affect, less depressed affect, and not anxious.     Impression & Recommendations:  Problem # 1:  DEPRESSION (ICD-311) Assessment Improved To work April 8 if ok His updated medication list for this problem includes:    Lorazepam 0.5 Mg Tabs (Lorazepam) .Marland Kitchen... 1 by mouth two times a day as needed for anxiety or insmnia    Paroxetine Hcl 10 Mg Tabs (Paroxetine hcl) .Marland Kitchen... 1 by mouth qd  Problem # 2:  ANXIETY (ICD-300.00) Assessment: Improved  His updated medication list for this problem includes:    Lorazepam 0.5 Mg Tabs (Lorazepam) .Marland Kitchen... 1 by mouth two times a day as needed for anxiety or insmnia    Paroxetine Hcl  10 Mg Tabs (Paroxetine hcl) .Marland Kitchen... 1 by mouth qd  Problem # 3:  Stress  Assessment: Improved  Complete Medication List: 1)  Ec-naprosyn 500 Mg Tbec (Naproxen) .... Once daily 2)  Bayer Low Strength 81 Mg Tbec (Aspirin) .... Take 1 by mouth qd 3)  Vitamin C 1000 Mg Tabs (Ascorbic acid) .... Take 1 by mouth qd 4)  Fish Oil 1000 Mg Caps (Omega-3 fatty acids) .... Take 1 by mouth qd 5)  Multivitamins Tabs (Multiple vitamin) .... Take 1 by mouth qd 6)   Doxycycline Hyclate 100 Mg Tabs (Doxycycline hyclate) .... One by mouth two times a day for 30 days 7)  Lorazepam 0.5 Mg Tabs (Lorazepam) .Marland Kitchen.. 1 by mouth two times a day as needed for anxiety or insmnia 8)  Paroxetine Hcl 10 Mg Tabs (Paroxetine hcl) .Marland Kitchen.. 1 by mouth qd  Patient Instructions: 1)  Please schedule a follow-up appointment in 2 months.

## 2010-04-19 NOTE — Progress Notes (Signed)
Summary: FMLA ?  Phone Note From Other Clinic   Caller: [phone] Summary of Call: HR called about FMLA forms. Please look at scanned document #6. You checked yes and no. Seems that yes is what you ment to put BUT the next part is checked no. Please review and clarify, THANKS Initial call taken by: Lamar Sprinkles, CMA,  January 04, 2010 11:34 AM  Follow-up for Phone Call        yes is correct for #6 Thank you!  Follow-up by: Tresa Garter MD,  January 08, 2010 7:28 AM  Additional Follow-up for Phone Call Additional follow up Details #1::        Please review more closely the second part of # 6. You put no on the second part but then put 1 day a week for reduced or part time work schedule Additional Follow-up by: Lamar Sprinkles, CMA,  January 09, 2010 10:46 AM    Additional Follow-up for Phone Call Additional follow up Details #2::    Yes, "1 d per wk" was an error - sorry. The form IS CONFUSING. It was meant to be a single block of time. Thx Follow-up by: Tresa Garter MD,  January 11, 2010 7:29 AM  Additional Follow-up for Phone Call Additional follow up Details #3:: Details for Additional Follow-up Action Taken: left mess to call office back...............Marland KitchenLamar Sprinkles, CMA  January 11, 2010 4:12 PM   FMLA form re-done 10/31 and faxed 11/01. Closing phone note Additional Follow-up by: Margaret Pyle, CMA,  January 17, 2010 2:47 PM

## 2010-04-19 NOTE — Assessment & Plan Note (Signed)
Summary: bp check/plot/cd  Medications Added NORVASC 5 MG TABS (AMLODIPINE BESYLATE) 1 by mouth once daily       Nurse Visit   Vital Signs:  Patient profile:   55 year old male BP sitting:   168 / 102  (left arm) Cuff size:   regular  Vitals Entered By: Lanier Prude, CMA(AAMA) (February 07, 2010 11:42 AM)  Allergies: No Known Drug Allergies  Orders Added: 1)  Est. Patient Level I [[address]109] Prescriptions: NORVASC 5 MG TABS (AMLODIPINE BESYLATE) 1 by mouth once daily  #90 x 3   Entered by:   Lanier Prude, CMA(AAMA)   Authorized by:   Tresa Garter MD   Signed by:   Lanier Prude, CMA(AAMA) on 02/07/2010   Method used:   [facility]  [address] [id number]       [address]       [place], [place]  [address]       Ph: [phone] or [phone]       Fax: [phone]   RxID:   [id number]

## 2010-04-19 NOTE — Progress Notes (Signed)
  Phone Note Other Incoming   Request: Send information Summary of Call: Request received from [employer] forwarded to [employer].

## 2010-04-19 NOTE — Progress Notes (Signed)
Summary: Rf Lorazepam  Phone Note Refill Request Message from:  Fax from Pharmacy  Refills Requested: Medication #1:  LORAZEPAM 0.5 MG TABS 1 by mouth two times a day as needed for anxiety or insmnia   Dosage confirmed as above?Dosage Confirmed   Supply Requested: 60   Last Refilled: 10/13/2009  Method Requested: Telephone to Pharmacy Initial call taken by: Lanier Prude, [facility]),  October 24, 2009 2:29 PM  Follow-up for Phone Call        ok x 1 Needs f/u OV Follow-up by: Tresa Garter MD,  October 24, 2009 5:47 PM  Additional Follow-up for Phone Call Additional follow up Details #1::        Rx called to pharmacy Additional Follow-up by: Lanier Prude, [facility]),  October 25, 2009 9:58 AM    Prescriptions: LORAZEPAM 0.5 MG TABS (LORAZEPAM) 1 by mouth two times a day as needed for anxiety or insmnia  #60 x 1   Entered by:   Lanier Prude, [facility])   Authorized by:   Tresa Garter MD   Signed by:   Lanier Prude, CMA(AAMA) on 10/25/2009   Method used:   [facility]ph[facility] to ...       [facility]  [address] [id number]       [address]       [place], [place]  [address]       Ph: [phone] or [phone]       Fax: [phone]   RxID:   [id number]

## 2010-04-19 NOTE — Progress Notes (Signed)
  Phone Note Other Incoming   Request: Send information Summary of Call: records release from [employer] forwarded to [employer].

## 2010-04-19 NOTE — Assessment & Plan Note (Signed)
Summary: increased anxiety-lb   Vital Signs:  Patient profile:   55 year old male Height:      69 inches Weight:      190 pounds BMI:     28.16 Temp:     98.2 degrees F oral Pulse rate:   80 / minute Pulse rhythm:   regular BP sitting:   120 / 80  (left arm) Cuff size:   regular  Vitals Entered By: Lanier Prude, CMA(AAMA) (December 20, 2009 1:23 PM) CC: anxiety Is Patient Diabetic? No   Primary Care Provider:  Tresa Garter MD  CC:  anxiety.  History of Present Illness: The patient presents for a follow up of worsening anxiety, depression. The stress is worse.  Current Medications (verified): 1)  Bayer Low Strength 81 Mg Tbec (Aspirin) .... Take 1 By Mouth Qd 2)  Vitamin C 1000 Mg Tabs (Ascorbic Acid) .... Take 1 By Mouth Qd 3)  Fish Oil 1000 Mg Caps (Omega-3 Fatty Acids) .... Take 1 By Mouth Qd 4)  Multivitamins  Tabs (Multiple Vitamin) .... Take 1 By Mouth Qd 5)  Lorazepam 0.5 Mg Tabs (Lorazepam) .Marland Kitchen.. 1 By Mouth Two Times A Day As Needed For Anxiety or Insmnia 6)  Paroxetine Hcl 10 Mg Tabs (Paroxetine Hcl) .Marland Kitchen.. 1 By Mouth Qd 7)  Trazodone Hcl 50 Mg Tabs (Trazodone Hcl) .Marland Kitchen.. 1 By Mouth Qhs 8)  Losartan Potassium 100 Mg Tabs (Losartan Potassium) .Marland Kitchen.. 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  The patient denies chest pain, dyspnea on exertion, and prolonged cough.    Physical Exam  General:  Tense ,alert, well-developed, well-nourished, and cooperative to examination.    Head:   Normocephalic and atraumatic without obvious abnormalities. No apparent alopecia or balding. Nose:  External nasal examination shows no deformity or inflammation. Nasal mucosa are pink and moist without lesions or exudates. Mouth:  Oral mucosa and oropharynx without lesions or exudates.  Teeth in good repair. Neck:  No deformities, masses, or tenderness noted. Lungs:  Normal respiratory effort, chest expands symmetrically. Lungs are clear to auscultation, no crackles or wheezes. Heart:  Normal rate and regular rhythm. S1 and S2 normal without gallop, murmur, click, rub or other extra sounds. Abdomen:  soft, non-tender, normal bowel sounds, no distention, no masses, no guarding, no rigidity, no rebound tenderness, no abdominal hernia, no inguinal hernia, no hepatomegaly, and no splenomegaly.   Msk:  No deformity or scoliosis noted of thoracic or lumbar spine.   Psych:  not suicidal, not homicidal, dysphoric affect, more depressed affect,  anxious.     Impression & Recommendations:  Problem # 1:  DEPRESSION (ICD-311) Assessment Deteriorated Sick leave x2 wks. Appt w/Dr Dellia Cloud is pending  His updated medication list for this problem includes:    Lorazepam 0.5 Mg Tabs (Lorazepam) .Marland Kitchen... 1 by mouth two times a day as needed for anxiety or insmnia    Paroxetine Hcl 10 Mg Tabs (Paroxetine hcl) .Marland Kitchen... 1 by mouth bid    Trazodone Hcl 50 Mg Tabs (Trazodone hcl) .Marland Kitchen... 1 by mouth qhs  Problem # 2:  ANXIETY (ICD-300.00) Assessment: Deteriorated  His updated medication list for this problem includes:    Lorazepam 0.5 Mg Tabs (Lorazepam) .Marland Kitchen... 1 by mouth two times a day as needed for anxiety or insmnia    Paroxetine Hcl 10 Mg Tabs (Paroxetine hcl) .Marland Kitchen... 1 by mouth bid    Trazodone Hcl 50 Mg Tabs (Trazodone hcl) .Marland Kitchen... 1 by mouth qhs  Problem # 3:  HYPERTENSION (ICD-401.9) Assessment: Comment Only  His updated medication list for this problem includes:    Losartan Potassium 100 Mg Tabs (Losartan  potassium) .Marland Kitchen... 1 by mouth once daily for blood pressure  Problem # 4:  INSOMNIA, CHRONIC (ICD-307.42) Assessment: Unchanged  Complete Medication List: 1)  Bayer Low Strength 81 Mg Tbec (Aspirin) .... Take 1 by mouth qd 2)  Vitamin C 1000 Mg Tabs (Ascorbic acid) .... Take 1 by mouth qd 3)  Fish Oil 1000 Mg Caps (Omega-3 fatty acids) .... Take 1 by mouth qd 4)  Multivitamins Tabs (Multiple vitamin) .... Take 1 by mouth qd 5)  Lorazepam 0.5 Mg Tabs (Lorazepam) .Marland Kitchen.. 1 by mouth two times a day as needed for anxiety or insmnia 6)  Paroxetine Hcl 10 Mg Tabs (Paroxetine hcl) .Marland Kitchen.. 1 by mouth bid 7)  Trazodone Hcl 50 Mg Tabs (Trazodone hcl) .Marland Kitchen.. 1 by mouth qhs 8)  Losartan Potassium 100 Mg Tabs (Losartan potassium) .Marland Kitchen.. 1 by mouth once daily for blood pressure  Patient Instructions: 1)  Please schedule a follow-up appointment in 2 weeks. Prescriptions: PAROXETINE HCL 10 MG TABS (PAROXETINE HCL) 1 by mouth bid  #60 x 6   Entered and Authorized by:   Tresa Garter MD   Signed by:   Tresa Garter MD on 12/20/2009   Method used:   Print then Give to Patient   RxID:   [id number]

## 2010-04-19 NOTE — Assessment & Plan Note (Signed)
Summary: 2 wk f/u #/cd   Vital Signs:  Patient profile:   55 year old male Height:      69 inches Weight:      197 pounds BMI:     29.20 Temp:     97.6 degrees F oral Pulse rate:   92 / minute Pulse rhythm:   regular Resp:     16 per minute BP sitting:   130 / 86  (left arm) Cuff size:   regular  Vitals Entered By: Lanier Prude, CMA(AAMA) (January 25, 2010 1:44 PM) CC: 2 wk f/u Is Patient Diabetic? No   Primary Care Provider:  Tresa Garter MD  CC:  2 wk f/u.  History of Present Illness: The patient presents for a follow up of back pain, anxiety, depression and HTN. He cont. to feel stressed. He has not found a new job yet. He asked for a demotion but this was declined. C/o anxiety attacks. He does not feel he could face returning to his previous job capacity and interact with his [employer] fast streamer at present.  Current Medications (verified): 1)  Bayer Low Strength 81 Mg Tbec (Aspirin) .... Take 1 By Mouth Qd 2)  Vitamin C 1000 Mg Tabs (Ascorbic Acid) .... Take 1 By Mouth Qd 3)  Fish Oil 1000 Mg Caps (Omega-3 Fatty Acids) .... Take 1 By Mouth Qd 4)  Multivitamins  Tabs (Multiple Vitamin) .... Take 1 By Mouth Qd 5)  Lorazepam 0.5 Mg Tabs (Lorazepam) .Marland Kitchen.. 1 By Mouth Two Times A Day As Needed For Anxiety or Insmnia 6)  Paroxetine Hcl 10 Mg Tabs (Paroxetine Hcl) .... 2 By Mouth At [facility] 7)  Trazodone Hcl 50 Mg Tabs (Trazodone Hcl) .Marland Kitchen.. 1 By Mouth Qhs 8)  Losartan Potassium 100 Mg Tabs (Losartan Potassium) .Marland Kitchen.. 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    Physical Exam  General:  Tense , alert, well-developed, well-nourished, and cooperative to examination.    Head:  Normocephalic and atraumatic without obvious abnormalities. No apparent alopecia or balding. Mouth:  Oral mucosa and oropharynx without lesions or exudates.  Teeth in good repair. Lungs:  Normal respiratory effort, chest expands symmetrically. Lungs are clear to auscultation, no crackles or wheezes. Heart:  Normal rate and regular rhythm. S1 and S2 normal without gallop, murmur, click, rub or other extra sounds. Abdomen:  soft, non-tender, normal bowel sounds, no distention, no masses, no guarding, no rigidity, no rebound tenderness, no abdominal hernia, no inguinal hernia, no hepatomegaly, and no splenomegaly.   Msk:  No deformity or scoliosis noted of thoracic or lumbar spine.   Extremities:  No clubbing, cyanosis, edema, or deformity noted with normal full range of motion of all joints.   Neurologic:  No cranial nerve deficits noted. Station and gait are normal. Plantar reflexes are down-going bilaterally. DTRs are symmetrical throughout. Sensory, motor and coordinative functions appear intact. Skin:  Intact without suspicious lesions or rashes Psych:  not suicidal, not homicidal, dysphoric affect, depressed affect,  anxious.     Impression & Recommendations:  Problem # 1:  ANXIETY (ICD-300.00) Assessment Unchanged Form was filled out The following medications were removed from the medication list:    Paroxetine Hcl 10 Mg Tabs (Paroxetine hcl) .Marland Kitchen... 2 by mouth at hs His updated medication list for this problem includes:    Lorazepam 0.5 Mg Tabs (Lorazepam) .Marland Kitchen... 1-2 by mouth two times a day as needed for anxiety or insmnia    Trazodone Hcl 50 Mg Tabs (Trazodone hcl) .Marland Kitchen... 1 by mouth qhs    Paroxetine Hcl 20 Mg Tabs (Paroxetine hcl) .Marland Kitchen... 2  by mouth qd Hope to be able to d/c to work in 2 wks if OK  Problem # 2:  DEPRESSION (ICD-311) Assessment: Unchanged  The following medications were removed from the medication list:    Paroxetine Hcl 10 Mg Tabs (Paroxetine hcl) .Marland Kitchen... 2 by mouth at hs His updated medication list for this problem includes:    Lorazepam 0.5 Mg Tabs (Lorazepam) .Marland Kitchen... 1-2 by mouth two times a day as needed for anxiety or insmnia    Trazodone Hcl 50 Mg Tabs (Trazodone hcl) .Marland Kitchen... 1 by mouth qhs    Paroxetine Hcl 20 Mg Tabs (Paroxetine hcl) .Marland Kitchen... 2 by mouth qd  Problem # 3:  INSOMNIA, CHRONIC (ICD-307.42) Assessment: Unchanged On the regimen of medicine(s) reflected in the chart    Problem # 4:  HYPERTENSION (ICD-401.9) Assessment: Improved  His updated medication list for this problem includes:    Losartan Potassium 100 Mg Tabs (Losartan potassium) .Marland Kitchen... 1 by mouth once daily for blood pressure  BP today: 130/86 Prior BP: 140/94 (01/08/2010)  Labs Reviewed: K+: 4.4 (05/02/2009) Creat: : 0.8 (05/02/2009)   Chol: 192 (05/16/2006)   HDL: 46.1 (05/16/2006)   LDL: 108 (05/16/2006)   TG: 187 (05/16/2006)  Complete Medication List: 1)  Bayer Low Strength 81 Mg Tbec (Aspirin) .... Take 1 by mouth qd 2)  Vitamin C 1000 Mg Tabs (Ascorbic acid) .... Take 1 by mouth qd 3)  Fish Oil 1000 Mg Caps (Omega-3 fatty acids) .... Take 1 by mouth qd 4)  Lorazepam 0.5 Mg Tabs (Lorazepam) .Marland Kitchen.. 1-2 by mouth two times a day as needed for anxiety or insmnia 5)  Trazodone Hcl 50 Mg Tabs (Trazodone hcl) .Marland Kitchen.. 1 by mouth qhs 6)  Losartan Potassium 100 Mg Tabs (Losartan potassium) .Marland Kitchen.. 1 by mouth once daily for blood pressure 7)  Paroxetine Hcl 20 Mg Tabs (Paroxetine hcl) .... 2 by mouth qd 8)  Multivitamins Tabs (Multiple vitamin) .... Take 1 by mouth qd  Patient Instructions: 1)  Please schedule a follow-up appointment in 2 weeks. Prescriptions: PAROXETINE HCL 20 MG TABS (PAROXETINE HCL) 2 by mouth qd  #60 x 6   Entered and  Authorized by:   Tresa Garter MD   Signed by:   Tresa Garter MD on 01/25/2010   Method used:   Print then Give to Patient   RxID:   [id number] LORAZEPAM 0.5 MG TABS (LORAZEPAM) 1-2 by mouth two times a day as needed for anxiety or insmnia  #100 x 1   Entered and Authorized by:   Tresa Garter MD   Signed by:   Tresa Garter MD on 01/25/2010   Method used:   Print then Give to Patient   RxID:   [id number]    Orders Added: 1)  Est. Patient Level V [44010]

## 2010-04-19 NOTE — Letter (Signed)
Summary: Generic Letter  [facility]  [address] [place], [place] [address]   Phone: [phone]  Fax: [phone]    02/16/2010  ZH:[id number] [facility]. [facility] [employer]   IO:[id number] [place] [address] [place], [place]  [address]  Dear Alex Gardener,   Mr Granillo has been suffering with severe depression and anxiety over past several months.  His condition was triggered by a very stressful situation at work. He has been receiving comprehensive treatment for his depression inclusive of medicines, psychiatry and psychology consultations/visits.  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  He will need another month, most likely, before he is able to return to work.           Sincerely,   Jacinta Shoe MD

## 2010-04-19 NOTE — Progress Notes (Signed)
Summary: Refill--Paroxetine  Phone Note Refill Request Message from:  Fax from Pharmacy on August 28, 2009 9:42 AM  Refills Requested: Medication #1:  PAROXETINE HCL 10 MG TABS 1 by mouth qd.   Supply Requested: 90 Initial call taken by: Lucious Groves,  August 28, 2009 9:42 AM    Prescriptions: PAROXETINE HCL 10 MG TABS (PAROXETINE HCL) 1 by mouth qd  #90 x 2   Entered by:   Lucious Groves   Authorized by:   Tresa Garter MD   Signed by:   Lucious Groves on 08/28/2009   Method used:   [facility] to        [facility]  [id number]       [address]       [place], [place]  [address]       Ph: [phone] or [phone]       Fax: [phone]   RxID:   [id number]

## 2010-04-19 NOTE — Progress Notes (Signed)
Summary: [employer] ?'s  Phone Note From Other Clinic   Caller: Shon Hale [phone] Summary of Call: [employer] Case manager is req a call back. She wants a better understanding of how pt presented at the office. Last office notes do not support anxiety & depression in MD's notes.  1.How did pt present in the office that made MD determine dx? 2. What is primary diagnosis?  3. What is tx plan? 4. When is next office visit? 5. Est return to work date?  Initial call taken by: Lamar Sprinkles, CMA,  January 23, 2010 4:05 PM  Follow-up for Phone Call        pls fax to her my OV notes for the past few visits and FMLA copies if we are allowed by pt to disclose info to [employer] Follow-up by: Tresa Garter MD,  January 24, 2010 8:02 AM  Additional Follow-up for Phone Call Additional follow up Details #1::        She did get last office visit notes and feels that more info (see above ?'s) is needed to determine claim. Please advise.................Marland KitchenLamar Sprinkles, CMA  January 24, 2010 2:20 PM     Additional Follow-up for Phone Call Additional follow up Details #2::    1He was depressed - could not focus, sleep, work. His BP was very high   He had high BP and looked anxious and depressed 2 Depression, situational 3 Rest, meds, councelling 4 Two weeks after his last OV 5 To be determined after his next OV  Thank you!  Follow-up by: Tresa Garter MD,  January 24, 2010 11:55 PM  Additional Follow-up for Phone Call Additional follow up Details #3:: Details for Additional Follow-up Action Taken: Left detailed vm on case managers vm....................Marland KitchenLamar Sprinkles, CMA  January 25, 2010 11:23 AM

## 2010-04-19 NOTE — Progress Notes (Signed)
Summary: OV IN AM  Phone Note Call from Patient Call back at Home Phone [phone]   Summary of Call: Patient is requesting a call back regarding his BP.  Initial call taken by: Lamar Sprinkles, CMA,  November 29, 2009 4:47 PM  Follow-up for Phone Call        Pt woke up and didn't feel quite right, no specific complaints. BP 175/130 per home machine. Later this afternoon BP was 155/110. Again, so specific complaints. He is scheduled for office visit tomorrow for eval. Advised pt to put new batteries in BP machine and recheck. He did & BP was 180/104 HR 107.  Advised pt to go to ER or call on call service for any symptoms or further questions. He agreed.  Follow-up by: Lamar Sprinkles, CMA,  November 29, 2009 5:08 PM  Additional Follow-up for Phone Call Additional follow up Details #1::        seen today Additional Follow-up by: Tresa Garter MD,  November 30, 2009 12:48 PM

## 2010-04-19 NOTE — Progress Notes (Signed)
Summary: Rf  Lorazepam  Phone Note Refill Request   Refills Requested: Medication #1:  LORAZEPAM 0.5 MG TABS 1 by mouth two times a day as needed for anxiety or insmnia   Dosage confirmed as above?Dosage Confirmed   Supply Requested: 60  Method Requested: Telephone to Pharmacy Next Appointment Scheduled: 01-14-10 Initial call taken by: Lanier Prude, [facility]),  January 15, 2010 10:03 AM  Follow-up for Phone Call        ok w/1 ref Follow-up by: Tresa Garter MD,  January 15, 2010 8:54 PM  Additional Follow-up for Phone Call Additional follow up Details #1::        Rx called to pharmacy Additional Follow-up by: Lanier Prude, [facility]),  January 16, 2010 9:28 AM    Prescriptions: LORAZEPAM 0.5 MG TABS (LORAZEPAM) 1 by mouth two times a day as needed for anxiety or insmnia  #60 x 1   Entered by:   Lanier Prude, [facility])   Authorized by:   Tresa Garter MD   Signed by:   Lanier Prude, CMA(AAMA) on 01/16/2010   Method used:   [facility]ph[facility] to ...       [facility]  [address] [id number]       [address]       [place], [place]  [address]       Ph: [phone] or [phone]       Fax: [phone]   RxID:   [id number]

## 2010-04-19 NOTE — Assessment & Plan Note (Signed)
Summary: 2 wk f/u   SS   Vital Signs:  Patient profile:   55 year old male Height:      69 inches Weight:      202 pounds BMI:     29.94 Temp:     98.0 degrees F oral Pulse rate:   88 / minute Pulse rhythm:   regular Resp:     16 per minute BP sitting:   122 / 82  (left arm) Cuff size:   regular  Vitals Entered By: Lanier Prude, [facility]) (February 15, 2010 9:38 AM) CC: f/u  Is Patient Diabetic? No Comments pt is not taking lorazepam.......it was changed   Primary Care Provider:  Tresa Garter MD  CC:  f/u .  History of Present Illness: The patient presents for a follow up of HTN, anxiety, depression. Overall  a little better however he is still very deppressed. He had seen Dr Carver Fila - a psychiatrist in [place]. He started Clonazepam. He has been to see  Dr Dellia Cloud.   Current Medications (verified): 1)  Bayer Low Strength 81 Mg Tbec (Aspirin) .... Take 1 By Mouth Qd 2)  Vitamin C 1000 Mg Tabs (Ascorbic Acid) .... Take 1 By Mouth Qd 3)  Fish Oil 1000 Mg Caps (Omega-3 Fatty Acids) .... Take 1 By Mouth Qd 4)  Lorazepam 0.5 Mg Tabs (Lorazepam) .Marland Kitchen.. 1-2 By Mouth Two Times A Day As Needed For Anxiety or Insmnia 5)  Trazodone Hcl 50 Mg Tabs (Trazodone Hcl) .Marland Kitchen.. 1 By Mouth Qhs 6)  Losartan Potassium 100 Mg Tabs (Losartan Potassium) .Marland Kitchen.. 1 By Mouth Once Daily For Blood Pressure 7)  Paroxetine Hcl 20 Mg Tabs (Paroxetine Hcl) .... 2 By Mouth Qd 8)  Multivitamins  Tabs (Multiple Vitamin) .... Take 1 By Mouth Qd 9)  Norvasc 5 Mg Tabs (Amlodipine Besylate) .Marland Kitchen.. 1 By Mouth Once Daily 10)  Clonazepam 0.5 Mg Tabs (Clonazepam) .Marland Kitchen.. 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  The patient denies difficulty walking.         Anxiety, insomnia  Physical Exam  General:  Tense , alert, well-developed, well-nourished, and cooperative to examination.    Nose:  External nasal examination shows no deformity or inflammation. Nasal mucosa are pink and moist without lesions or exudates. Mouth:  Oral mucosa and oropharynx without lesions or exudates.  Teeth in good repair. Neck:  No deformities, masses, or tenderness noted. Lungs:  Normal respiratory effort, chest expands symmetrically. Lungs are clear to auscultation, no crackles or wheezes. Heart:  Normal rate and regular rhythm. S1 and S2 normal without gallop, murmur, click, rub or other extra sounds. Abdomen:  soft, non-tender, normal bowel sounds, no distention, no masses, no guarding, no rigidity, no rebound tenderness, no abdominal hernia, no inguinal hernia, no hepatomegaly, and no splenomegaly.   Msk:  No deformity or scoliosis noted of thoracic or lumbar spine.   Extremities:  No clubbing, cyanosis, edema, or deformity noted with normal full range of motion of all joints.   Neurologic:  No cranial nerve  deficits noted. Station and gait are normal. Plantar reflexes are down-going bilaterally. DTRs are symmetrical throughout. Sensory, motor and coordinative functions appear intact. Skin:  Intact without suspicious lesions or rashes Psych:  not suicidal, not homicidal, less dysphoric affect, depressed affect,  anxious.     Impression & Recommendations:  Problem # 1:  DEPRESSION (ICD-311) - slow improvement Assessment Unchanged Discussed. He is still disabled and unable to work Stay at home through Dec - well reeval in 1 month On the regimen of medicine(s) reflected in the chart    Problem # 2:  ANXIETY (ICD-300.00) - slow  improvement Assessment: Unchanged  His updated medication list for this problem includes:    Lorazepam 0.5 Mg Tabs (Lorazepam) .Marland Kitchen... 1-2 by mouth two times a day as needed for anxiety or insmnia    Trazodone Hcl 50 Mg Tabs (Trazodone hcl) .Marland Kitchen... 1 by mouth qhs    Paroxetine Hcl 20 Mg Tabs (Paroxetine hcl) .Marland Kitchen... 2 by mouth qd    Clonazepam 0.5 Mg Tabs (Clonazepam) .Marland Kitchen... 1 by mouth every morning and 2 by mouth at bedtime  Problem # 3:  HYPERTENSION (ICD-401.9) Assessment: Improved  His updated medication list for this problem includes:    Losartan Potassium 100 Mg Tabs (Losartan potassium) .Marland Kitchen... 1 by mouth once daily for blood pressure    Norvasc 5 Mg Tabs (Amlodipine besylate) .Marland Kitchen... 1 by mouth once daily  Problem # 4:  INSOMNIA, CHRONIC (ICD-307.42) Assessment: Improved  Complete Medication List: 1)  Bayer Low Strength 81 Mg Tbec (Aspirin) .... Take 1 by mouth qd 2)  Vitamin C 1000 Mg Tabs (Ascorbic acid) .... Take 1 by mouth qd 3)  Fish Oil 1000 Mg Caps (Omega-3 fatty acids) .... Take 1 by mouth qd 4)  Lorazepam 0.5 Mg Tabs (Lorazepam) .Marland Kitchen.. 1-2 by mouth two times a day as needed for anxiety or insmnia 5)  Trazodone Hcl 50 Mg Tabs (Trazodone hcl) .Marland Kitchen.. 1 by mouth qhs 6)  Losartan Potassium 100 Mg Tabs (Losartan potassium) .Marland Kitchen.. 1 by mouth once daily for blood pressure 7)  Paroxetine Hcl 20 Mg Tabs (Paroxetine hcl) .... 2 by mouth qd 8)  Multivitamins Tabs (Multiple vitamin) .... Take 1 by mouth qd 9)  Norvasc 5 Mg Tabs (Amlodipine besylate) .Marland Kitchen.. 1 by mouth once daily 10)  Clonazepam 0.5 Mg Tabs (Clonazepam) .Marland Kitchen.. 1 by mouth every morning and 2 by mouth at bedtime  Patient Instructions: 1)  Please schedule a follow-up appointment in 1 month.   Orders Added: 1)  Est. Patient Level IV [40981]

## 2010-04-19 NOTE — Progress Notes (Signed)
Summary: disability   Phone Note From Other Clinic   Caller: [employer] disability [phone] Summary of Call: Requesting a call back checking status of form sent last week.Alvy Beal Archie CMA  February 12, 2010 1:49 PM   Follow-up for Phone Call        Patient brought in forms for MD - They are completed & to be sent next week.  Follow-up by: Lamar Sprinkles, CMA,  February 17, 2010 12:16 PM

## 2010-04-19 NOTE — Letter (Signed)
Summary: Out of Work  [facility]  [address] [place], [place] [address]   Phone: [phone]  Fax: [phone]    May 02, 2009   Employee:  Austin Aguilar Billups    To Whom It May Concern:   For Medical reasons, please excuse the above named employee from work for the following dates:  Start:   05/02/09  End:   05/05/09  If you need additional information, please feel free to contact our office.         Sincerely,    Etta Grandchild MD

## 2010-04-19 NOTE — Assessment & Plan Note (Signed)
Summary: stress/anxiety/#/cd   Vital Signs:  Patient profile:   55 year old male Weight:      187 pounds Temp:     97.1 degrees F oral Pulse rate:   96 / minute BP sitting:   136 / 88  (left arm)  Vitals Entered By: Tora Perches (May 23, 2009 2:16 PM) CC: stress, anxiety Is Patient Diabetic? No   Primary Care Provider:  Tresa Garter MD  CC:  stress and anxiety.  History of Present Illness: C/o being stressed out at work, depression and anxiety. He is worn out; can't focus and can't sleep x months.   Preventive Screening-Counseling & Management  Alcohol-Tobacco     Smoking Status: quit  Current Medications (verified): 1)  Ec-Naprosyn 500 Mg  Tbec (Naproxen) .... Once Daily 2)  Bayer Low Strength 81 Mg Tbec (Aspirin) .... Take 1 By Mouth Qd 3)  Vitamin C 1000 Mg Tabs (Ascorbic Acid) .... Take 1 By Mouth Qd 4)  Fish Oil 1000 Mg Caps (Omega-3 Fatty Acids) .... Take 1 By Mouth Qd 5)  Multivitamins  Tabs (Multiple Vitamin) .... Take 1 By Mouth Qd 6)  Doxycycline Hyclate 100 Mg Tabs (Doxycycline Hyclate) .... 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  The patient denies dyspnea on exertion, abdominal pain, and melena.         sleepy  Physical Exam  General:  Tense,alert, well-developed, well-nourished, and cooperative to examination.    Head:  Normocephalic and atraumatic without obvious abnormalities. No apparent alopecia or balding. Eyes:  vision grossly intact; pupils equal, round and reactive to light.  conjunctiva and lids normal.      Nose:  External nasal examination shows no deformity or inflammation. Nasal mucosa are pink and moist without lesions or exudates. Mouth:  Oral mucosa and oropharynx without lesions or exudates.  Teeth in good repair. Neck:  No deformities, masses, or tenderness noted. Lungs:  Normal respiratory effort, chest expands symmetrically. Lungs are clear to auscultation, no crackles or wheezes. Heart:  Normal rate and regular rhythm. S1 and S2 normal without gallop, murmur, click, rub or other extra sounds. Abdomen:  soft, non-tender, normal bowel sounds, no distention, no masses, no guarding, no rigidity, no rebound tenderness, no abdominal hernia, no inguinal hernia, no hepatomegaly, and no splenomegaly.   Msk:  No deformity or scoliosis noted of thoracic or lumbar spine.   Extremities:  No clubbing, cyanosis, edema, or deformity noted with normal full range of motion of all joints.   Neurologic:  No cranial nerve deficits noted. Station and gait are normal. Plantar reflexes are down-going bilaterally. DTRs are symmetrical throughout. Sensory, motor and coordinative functions appear intact. Skin:  Intact without suspicious lesions or rashes Psych:  not suicidal, not homicidal, dysphoric affect, depressed affect, and slightly anxious.     Impression & Recommendations:  Problem # 1:  DEPRESSION (ICD-311) Assessment New Off work x 1 mo His updated medication list for this problem includes:    Lorazepam 0.5 Mg Tabs (Lorazepam) .Marland Kitchen... 1 by mouth two times a day as needed for anxiety or  insmnia    Paroxetine Hcl 10 Mg Tabs (Paroxetine hcl) .Marland Kitchen... 1 by mouth qd  Orders: Psychology Referral (Psychology)  Problem # 2:  ANXIETY (ICD-300.00) Assessment: New  His updated medication list for this problem includes:    Lorazepam 0.5 Mg Tabs (Lorazepam) .Marland Kitchen... 1 by mouth two times a day as needed for anxiety or insmnia    Paroxetine Hcl 10 Mg Tabs (Paroxetine hcl) .Marland Kitchen... 1 by mouth qd  Orders: Psychology  Referral (Psychology)  Complete Medication List: 1)  Ec-naprosyn 500 Mg Tbec (Naproxen) .... Once daily 2)  Bayer Low Strength 81 Mg Tbec (Aspirin) .... Take 1 by mouth qd 3)  Vitamin C 1000 Mg Tabs (Ascorbic acid) .... Take 1 by mouth qd 4)  Fish Oil 1000 Mg Caps (Omega-3 fatty acids) .... Take 1 by mouth qd 5)  Multivitamins Tabs (Multiple vitamin) .... Take 1 by mouth qd 6)  Doxycycline Hyclate 100 Mg Tabs (Doxycycline hyclate) .... One by mouth two times a day for 30 days 7)  Lorazepam 0.5 Mg Tabs (Lorazepam) .Marland Kitchen.. 1 by mouth two times a day as needed for anxiety or insmnia 8)  Paroxetine Hcl 10 Mg Tabs (Paroxetine hcl) .Marland Kitchen.. 1 by mouth qd  Patient Instructions: 1)  Please schedule a follow-up appointment in 3 weeks. Prescriptions: PAROXETINE HCL 10 MG TABS (PAROXETINE HCL) 1 by mouth qd  #30 x 6   Entered and Authorized by:   Tresa Garter MD   Signed by:   Tresa Garter MD on 05/23/2009   Method used:   Print then Give to Patient   RxID:   [id number] LORAZEPAM 0.5 MG TABS (LORAZEPAM) 1 by mouth two times a day as needed for anxiety or insmnia  #60 x 2   Entered and Authorized by:   Tresa Garter MD   Signed by:   Tresa Garter MD on 05/23/2009   Method used:   Print then Give to Patient   RxID:   [id number]

## 2010-04-19 NOTE — Assessment & Plan Note (Signed)
Summary: 2 WK ROV /NWS   Vital Signs:  Patient profile:   55 year old male Height:      69 inches Weight:      193 pounds BMI:     28.60 Temp:     98.6 degrees F oral Pulse rate:   84 / minute Pulse rhythm:   regular Resp:     16 per minute BP sitting:   140 / 94  (left arm) Cuff size:   regular  Vitals Entered By: Lanier Prude, CMA(AAMA) (January 08, 2010 3:40 PM) CC: 2 wk f/u Is Patient Diabetic? No   Primary Care Provider:  Tresa Garter MD  CC:  2 wk f/u.  History of Present Illness: The patient presents for a follow up of  anxiety, depression - feeling some better. He is still very stressed. F/u HTN, insomnia.  Current Medications (verified): 1)  Bayer Low Strength 81 Mg Tbec (Aspirin) .... Take 1 By Mouth Qd 2)  Vitamin C 1000 Mg Tabs (Ascorbic Acid) .... Take 1 By Mouth Qd 3)  Fish Oil 1000 Mg Caps (Omega-3 Fatty Acids) .... Take 1 By Mouth Qd 4)  Multivitamins  Tabs (Multiple Vitamin) .... Take 1 By Mouth Qd 5)  Lorazepam 0.5 Mg Tabs (Lorazepam) .Marland Kitchen.. 1 By Mouth Two Times A Day As Needed For Anxiety or Insmnia 6)  Paroxetine Hcl 10 Mg Tabs (Paroxetine Hcl) .Marland Kitchen.. 1 By Mouth Bid 7)  Trazodone Hcl 50 Mg Tabs (Trazodone Hcl) .Marland Kitchen.. 1 By Mouth Qhs 8)  Losartan Potassium 100 Mg Tabs (Losartan Potassium) .Marland Kitchen.. 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  The patient denies chest pain, syncope, and dyspnea on exertion.    Physical Exam  General:  Tense ,alert, well-developed,  well-nourished, and cooperative to examination.    Nose:  External nasal examination shows no deformity or inflammation. Nasal mucosa are pink and moist without lesions or exudates. Mouth:  Oral mucosa and oropharynx without lesions or exudates.  Teeth in good repair. Lungs:  Normal respiratory effort, chest expands symmetrically. Lungs are clear to auscultation, no crackles or wheezes. Heart:  Normal rate and regular rhythm. S1 and S2 normal without gallop, murmur, click, rub or other extra sounds. Abdomen:  soft, non-tender, normal bowel sounds, no distention, no masses, no guarding, no rigidity, no rebound tenderness, no abdominal hernia, no inguinal hernia, no hepatomegaly, and no splenomegaly.   Msk:  No deformity or scoliosis noted of thoracic or lumbar spine.   Extremities:  No clubbing, cyanosis, edema, or deformity noted with normal full range of motion of all joints.   Neurologic:  No cranial nerve deficits noted. Station and gait are normal. Plantar reflexes are down-going bilaterally. DTRs are symmetrical throughout. Sensory, motor and coordinative functions appear intact. Skin:  Intact without suspicious lesions or rashes Psych:  not suicidal, not homicidal, dysphoric affect, depressed affect,  anxious.     Impression & Recommendations:  Problem # 1:  DEPRESSION (ICD-311) Assessment Unchanged Appt w/Dr Dellia Cloud is pending on Thursday. He declined a Psychiatry appt. Cont. on a med.  leave His updated medication list for this problem includes:    Lorazepam 0.5 Mg Tabs (Lorazepam) .Marland Kitchen... 1 by mouth two times a day as needed for anxiety or insmnia    Paroxetine Hcl 10 Mg Tabs (Paroxetine hcl) .Marland Kitchen... 2 by mouth at hs    Trazodone Hcl 50 Mg Tabs (Trazodone hcl) .Marland Kitchen... 1 by mouth qhs  Problem # 2:  ANXIETY (ICD-300.00) Assessment: Improved  His updated medication list for this problem includes:    Lorazepam 0.5 Mg Tabs (Lorazepam) .Marland Kitchen... 1 by mouth two times a day as needed for anxiety  or insmnia    Paroxetine Hcl 10 Mg Tabs (Paroxetine hcl) .Marland Kitchen... 2 by mouth at hs    Trazodone Hcl 50 Mg Tabs (Trazodone hcl) .Marland Kitchen... 1 by mouth qhs  Problem # 3:  HYPERTENSION (ICD-401.9) Assessment: Unchanged  His updated medication list for this problem includes:    Losartan Potassium 100 Mg Tabs (Losartan potassium) .Marland Kitchen... 1 by mouth once daily for blood pressure  BP today: 140/94 Prior BP: 120/80 (12/20/2009)  Labs Reviewed: K+: 4.4 (05/02/2009) Creat: : 0.8 (05/02/2009)   Chol: 192 (05/16/2006)   HDL: 46.1 (05/16/2006)   LDL: 108 (05/16/2006)   TG: 187 (05/16/2006)  Problem # 4:  INSOMNIA, CHRONIC (ICD-307.42) Assessment: Unchanged On the regimen of medicine(s) reflected in the chart    Complete Medication List: 1)  Bayer Low Strength 81 Mg Tbec (Aspirin) .... Take 1 by mouth qd 2)  Vitamin C 1000 Mg Tabs (Ascorbic acid) .... Take 1 by mouth qd 3)  Fish Oil 1000 Mg Caps (Omega-3 fatty acids) .... Take 1 by mouth qd 4)  Multivitamins Tabs (Multiple vitamin) .... Take 1 by mouth qd 5)  Lorazepam 0.5 Mg Tabs (Lorazepam) .Marland Kitchen.. 1 by mouth two times a day as needed for anxiety or insmnia 6)  Paroxetine Hcl 10 Mg Tabs (Paroxetine hcl) .... 2 by mouth at hs 7)  Trazodone Hcl 50 Mg Tabs (Trazodone hcl) .Marland Kitchen.. 1 by mouth qhs 8)  Losartan Potassium 100 Mg Tabs (Losartan potassium) .Marland Kitchen.. 1 by mouth once daily for blood pressure  Patient Instructions: 1)  Please schedule a follow-up appointment in 2 weeks.   Orders Added: 1)  Est. Patient Level IV [16109]

## 2010-04-19 NOTE — Progress Notes (Signed)
Summary: Attending Physicians Statement for completion from [employer]  Attending Physicians Statement for completion from [employer]. Request forwarded to [employer]. Wilder Glade  June 05, 2009 12:53 PM

## 2010-06-08 ENCOUNTER — Ambulatory Visit (INDEPENDENT_AMBULATORY_CARE_PROVIDER_SITE_OTHER): Payer: PRIVATE HEALTH INSURANCE | Admitting: Internal Medicine

## 2010-06-08 ENCOUNTER — Encounter: Payer: Self-pay | Admitting: Internal Medicine

## 2010-06-08 DIAGNOSIS — G4700 Insomnia, unspecified: Secondary | ICD-10-CM

## 2010-06-08 DIAGNOSIS — F329 Major depressive disorder, single episode, unspecified: Secondary | ICD-10-CM

## 2010-06-08 DIAGNOSIS — I10 Essential (primary) hypertension: Secondary | ICD-10-CM

## 2010-06-08 DIAGNOSIS — F411 Generalized anxiety disorder: Secondary | ICD-10-CM

## 2010-06-08 DIAGNOSIS — Z0000 Encounter for general adult medical examination without abnormal findings: Secondary | ICD-10-CM

## 2010-06-08 DIAGNOSIS — F3289 Other specified depressive episodes: Secondary | ICD-10-CM

## 2010-06-08 NOTE — Assessment & Plan Note (Signed)
Cont Rx. He is unemployed (was terminated) but feeling better.Marland KitchenMarland Kitchen

## 2010-06-08 NOTE — Assessment & Plan Note (Signed)
Cont Rx 

## 2010-06-08 NOTE — Assessment & Plan Note (Signed)
Better with Rx. Bad dreams at times are present

## 2010-06-08 NOTE — Assessment & Plan Note (Signed)
Improved. On Rx

## 2010-06-08 NOTE — Progress Notes (Signed)
  Subjective:    Patient ID: Austin Aguilar, male    DOB: 01-22-1956, 55 y.o.   MRN: [id number]  HPI  The patient presents for a follow-up visit to check on  Depression, anxiety, HTN. Overall better.   Review of Systems  Constitutional: Negative for appetite change, fatigue and unexpected weight change.  HENT: Negative for nosebleeds, congestion, sore throat, sneezing, trouble swallowing and neck pain.   Eyes: Negative for itching and visual disturbance.  Respiratory: Negative for cough.   Cardiovascular: Negative for chest pain, palpitations and leg swelling.  Gastrointestinal: Negative for nausea, diarrhea, blood in stool and abdominal distention.  Genitourinary: Negative for frequency and hematuria.  Musculoskeletal: Negative for back pain, joint swelling and gait problem.  Skin: Negative for rash.  Neurological: Negative for dizziness, tremors, speech difficulty and weakness.  Psychiatric/Behavioral: Positive for sleep disturbance and dysphoric mood. Negative for suicidal ideas, self-injury and agitation. The patient is not nervous/anxious.        Objective:   Physical Exam  Constitutional: He is oriented to person, place, and time. He appears well-developed.  HENT:  Mouth/Throat: Oropharynx is clear and moist.  Eyes: Conjunctivae are normal. Pupils are equal, round, and reactive to light.  Neck: Normal range of motion. No JVD present. No thyromegaly present.  Cardiovascular: Normal rate, regular rhythm, normal heart sounds and intact distal pulses.  Exam reveals no gallop and no friction rub.   No murmur heard. Pulmonary/Chest: Effort normal and breath sounds normal. No respiratory distress. He has no wheezes. He has no rales. He exhibits no tenderness.  Abdominal: Soft. Bowel sounds are normal. He exhibits no distension and no mass. There is no tenderness. There is no rebound and no guarding.  Musculoskeletal: Normal range of motion. He exhibits no edema and no tenderness.    Lymphadenopathy:    He has no cervical adenopathy.  Neurological: He is alert and oriented to person, place, and time. He has normal reflexes. No cranial nerve deficit. He exhibits normal muscle tone. Coordination normal.  Skin: Skin is warm and dry. No rash noted.  Psychiatric: He has a normal mood and affect. His behavior is normal. Judgment and thought content normal.       Much better          Assessment & Plan:  DEPRESSION Cont Rx. He is unemployed (was terminated) but feeling better...  ANXIETY Cont Rx  HYPERTENSION Improved. On Rx  INSOMNIA, CHRONIC Better with Rx. Bad dreams at times are present     BP Readings from Last 3 Encounters:  06/08/10 138/88  03/16/10 122/90  02/15/10 122/82    Lab Results  Component Value Date   WBC 5.1 05/02/2009   HGB 15.5 05/02/2009   HGB NEGATIVE 05/02/2009   HCT 45.2 05/02/2009   PLT 225.0 05/02/2009   CHOL 192 05/16/2006   TRIG 187* 05/16/2006   HDL 46.1 05/16/2006   ALT 28 05/02/2009   AST 23 05/02/2009   NA 140 05/02/2009   K 4.4 05/02/2009   CL 106 05/02/2009   CREATININE 0.8 05/02/2009   BUN 10 05/02/2009   CO2 29 05/02/2009   TSH 1.43 05/16/2006   PSA 1.07 05/02/2009

## 2010-06-13 ENCOUNTER — Other Ambulatory Visit: Payer: Self-pay | Admitting: *Deleted

## 2010-06-13 NOTE — Telephone Encounter (Signed)
Rec Rf req Trazodone 50mg  1 po qhs # 30.... Last Rf 05-13-10

## 2010-06-14 ENCOUNTER — Telehealth: Payer: Self-pay | Admitting: *Deleted

## 2010-06-14 NOTE — Telephone Encounter (Signed)
OK to fill this prescription with additional refills x1 Thank you!  

## 2010-06-14 NOTE — Telephone Encounter (Signed)
rec rf req for Trazodone 50mg    1 po qhs # 30... Last filled 05-13-10..... Ok to Rf?

## 2010-06-15 MED ORDER — TRAZODONE HCL 50 MG PO TABS: 50.0000 mg | ORAL_TABLET | Freq: Every day | ORAL | Status: DC

## 2010-06-15 NOTE — Telephone Encounter (Signed)
Rf phoned in.  

## 2010-06-20 NOTE — Telephone Encounter (Signed)
OK to fill this prescription with additional refills x5 Thank you!  

## 2010-06-21 MED ORDER — TRAZODONE HCL 50 MG PO TABS: 50.0000 mg | ORAL_TABLET | Freq: Every day | ORAL | Status: DC

## 2010-06-21 NOTE — Telephone Encounter (Signed)
rf phoned in 

## 2010-08-03 NOTE — Assessment & Plan Note (Signed)
[facility]                           PRIMARY CARE OFFICE NOTE   NAME:Austin Aguilar, Cooksey                     MRN:          [id number]  DATE:05/20/2006                            DOB:          August 28, 1955    The patient is a 55 year old male who presents for a wellness  examination.  He has not been seen here since 2002.  No active  complaints.   ALLERGIES:  None.   MEDICATIONS:  None.   PAST MEDICAL HISTORY:  Unremarkable.   FAMILY HISTORY:  Both parents are living.  Father has had coronary  disease.   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  He smokes cigars very infrequently.  Several beers at  night.   REVIEW OF SYSTEMS:  No chest pain or shortness of breath.  No blood in  the stool.  No syncope.  Denies depression.  Insomnia at times.  No  cough.  The rest of his 18-point review of systems is negative.   PHYSICAL EXAMINATION:  VITAL SIGNS:  Blood pressure 126/89, pulse 91,  temperature 98.5, weight 181 pounds.  GENERAL:  Looks well.  HEENT:  Moist mucosa.  NECK:  Supple.  No thyromegaly or bruit.  LUNGS:  Clear to auscultation and percussion.  No wheezes or rales.  HEART:  S1 and S2.  No murmur, no gallop.  ABDOMEN:  Soft, nontender.  No organomegaly or mass was felt.  LOWER EXTREMITIES:  Without edema.  NEUROLOGIC:  He is alert, oriented and cooperative.  Denies being  depressed.  RECTAL:  Normal prostate.  No nodules, no masses.  Stool is guaiac  negative.  SKIN:  Clear.   LABORATORY DATA:  EKG with normal sinus rhythm, incomplete right bundle  branch block.  Laboratory work on May 16, 2006 revealed CBC normal.  Sodium 134.  LFTs normal.  BMET normal.  Cholesterol 192, LDL 108, HDL  4.  TSH normal.  PSA normal.  Urinalysis normal.   ASSESSMENT AND PLAN:  Normal wellness examination.  Age/health-related  issues discussed.  Healthy lifestyle discussed.  Repeat examination in  12  months.  Start baby aspirin  daily.  Schedule screening colonoscopy with Dr. Russella Dar.  Obtain chest x-  ray.  Maintain a healthy lifestyle.  Limit alcohol drinks ideally to  less than 2 per night.     Georgina Quint. Plotnikov, MD  Electronically Signed    AVP/MedQ  DD: 05/20/2006  DT: 05/21/2006  Job #: [id number]   cc:   Venita Lick. Russella Dar, MD, Clementeen Graham

## 2010-09-03 ENCOUNTER — Other Ambulatory Visit (INDEPENDENT_AMBULATORY_CARE_PROVIDER_SITE_OTHER): Payer: PRIVATE HEALTH INSURANCE

## 2010-09-03 DIAGNOSIS — Z0000 Encounter for general adult medical examination without abnormal findings: Secondary | ICD-10-CM

## 2010-09-03 LAB — URINALYSIS
Bilirubin Urine: NEGATIVE
Hgb urine dipstick: NEGATIVE
Leukocytes, UA: NEGATIVE
Nitrite: NEGATIVE
Urobilinogen, UA: 0.2 (ref 0.0–1.0)
pH: 6 (ref 5.0–8.0)

## 2010-09-03 LAB — HEPATIC FUNCTION PANEL
ALT: 28 U/L (ref 0–53)
Albumin: 4.7 g/dL (ref 3.5–5.2)
Total Bilirubin: 0.6 mg/dL (ref 0.3–1.2)
Total Protein: 7.3 g/dL (ref 6.0–8.3)

## 2010-09-03 LAB — CBC WITH DIFFERENTIAL/PLATELET
Basophils Absolute: 0 10*3/uL (ref 0.0–0.1)
Eosinophils Absolute: 0 10*3/uL (ref 0.0–0.7)
Lymphocytes Relative: 11.2 % — ABNORMAL LOW (ref 12.0–46.0)
Monocytes Relative: 9.4 % (ref 3.0–12.0)
Neutrophils Relative %: 79.1 % — ABNORMAL HIGH (ref 43.0–77.0)
Platelets: 234 10*3/uL (ref 150.0–400.0)
RDW: 12.3 % (ref 11.5–14.6)

## 2010-09-03 LAB — LIPID PANEL
Cholesterol: 198 mg/dL (ref 0–200)
HDL: 56.3 mg/dL (ref >39.00)
VLDL: 8.8 mg/dL (ref 0.0–40.0)

## 2010-09-03 LAB — BASIC METABOLIC PANEL WITH GFR
CO2: 26 mEq/L (ref 19–32)
Calcium: 9.7 mg/dL (ref 8.4–10.5)
Creat: 0.71 mg/dL (ref 0.50–1.35)

## 2010-09-03 LAB — TSH: TSH: 1.11 u[IU]/mL (ref 0.35–5.50)

## 2010-09-07 ENCOUNTER — Encounter: Payer: Self-pay | Admitting: Internal Medicine

## 2010-09-07 ENCOUNTER — Ambulatory Visit (INDEPENDENT_AMBULATORY_CARE_PROVIDER_SITE_OTHER): Payer: BC Managed Care – PPO | Admitting: Internal Medicine

## 2010-09-07 VITALS — BP 130/86 | HR 80 | Temp 97.8°F | Resp 16 | Ht 69.0 in | Wt 191.0 lb

## 2010-09-07 DIAGNOSIS — I10 Essential (primary) hypertension: Secondary | ICD-10-CM

## 2010-09-07 DIAGNOSIS — F329 Major depressive disorder, single episode, unspecified: Secondary | ICD-10-CM

## 2010-09-07 DIAGNOSIS — Z0000 Encounter for general adult medical examination without abnormal findings: Secondary | ICD-10-CM | POA: Insufficient documentation

## 2010-09-07 NOTE — Patient Instructions (Signed)
You can taper off Paxil as we discussed

## 2010-09-07 NOTE — Assessment & Plan Note (Signed)
Cont Rx 

## 2010-09-07 NOTE — Assessment & Plan Note (Signed)
We discussed age appropriate health related issues, including available/recomended screening tests and vaccinations. We discussed a need for adhering to healthy diet and exercise. Labs/EKG were reviewed/ordered. All questions were answered. He lost 20 lbs

## 2010-09-07 NOTE — Assessment & Plan Note (Signed)
On Paxil x 12+ mo - will taper off Trazodone

## 2010-09-07 NOTE — Progress Notes (Signed)
  Subjective:    Patient ID: Austin Aguilar, male    DOB: April 01, 1955, 55 y.o.   MRN: [id number]  HPI The patient is here for a wellness exam. The patient has been doing well overall without major physical or psychological issues going on lately. Doing well   Review of Systems  Constitutional: Negative for appetite change, fatigue and unexpected weight change.  HENT: Negative for nosebleeds, congestion, sore throat, sneezing, trouble swallowing and neck pain.   Eyes: Negative for itching and visual disturbance.  Respiratory: Negative for cough and choking.   Cardiovascular: Negative for chest pain, palpitations and leg swelling.  Gastrointestinal: Negative for nausea, diarrhea, blood in stool and abdominal distention.  Genitourinary: Negative for frequency and hematuria.  Musculoskeletal: Negative for back pain, joint swelling and gait problem.  Skin: Negative for rash.  Neurological: Negative for dizziness, tremors, speech difficulty and weakness.  Psychiatric/Behavioral: Negative for suicidal ideas, sleep disturbance, dysphoric mood and agitation. The patient is not nervous/anxious.    Wt Readings from Last 3 Encounters:  09/07/10 191 lb (86.637 kg)  06/08/10 209 lb (94.802 kg)  03/16/10 207 lb (93.895 kg)        Objective:   Physical Exam  Constitutional: He is oriented to person, place, and time. He appears well-developed and well-nourished. No distress.  HENT:  Head: Normocephalic and atraumatic.  Right Ear: External ear normal.  Left Ear: External ear normal.  Nose: Nose normal.  Mouth/Throat: Oropharynx is clear and moist. No oropharyngeal exudate.  Eyes: Conjunctivae and EOM are normal. Pupils are equal, round, and reactive to light. Right eye exhibits no discharge. Left eye exhibits no discharge. No scleral icterus.  Neck: Normal range of motion. Neck supple. No JVD present. No tracheal deviation present. No thyromegaly present.  Cardiovascular: Normal rate, regular  rhythm, normal heart sounds and intact distal pulses.  Exam reveals no gallop and no friction rub.   No murmur heard. Pulmonary/Chest: Effort normal and breath sounds normal. No stridor. No respiratory distress. He has no wheezes. He has no rales. He exhibits no tenderness.  Abdominal: Soft. Bowel sounds are normal. He exhibits no distension and no mass. There is no tenderness. There is no rebound and no guarding.  Genitourinary: Rectum normal, prostate normal and penis normal. Guaiac negative stool. No penile tenderness.  Musculoskeletal: Normal range of motion. He exhibits no edema and no tenderness.  Lymphadenopathy:    He has no cervical adenopathy.  Neurological: He is alert and oriented to person, place, and time. He has normal reflexes. No cranial nerve deficit. He exhibits normal muscle tone. Coordination normal.  Skin: Skin is warm and dry. No rash noted. He is not diaphoretic. No erythema. No pallor.  Psychiatric: His behavior is normal. Judgment and thought content normal.  Umbilical 2.5 cm and upper l.alba L prox bicepse hurts     Lab Results  Component Value Date   WBC 7.6 09/03/2010   HGB 13.7 09/03/2010   HCT 39.0 09/03/2010   PLT 234.0 09/03/2010   CHOL 198 09/03/2010   TRIG 44.0 09/03/2010   HDL 56.30 09/03/2010   ALT 28 09/03/2010   AST 29 09/03/2010   NA 139 09/03/2010   K 4.3 09/03/2010   CL 105 09/03/2010   CREATININE 0.71 09/03/2010   BUN 18 09/03/2010   CO2 26 09/03/2010   TSH 1.11 09/03/2010   PSA 1.07 05/02/2009     Assessment & Plan:

## 2010-09-15 ENCOUNTER — Encounter: Payer: Self-pay | Admitting: Internal Medicine

## 2010-12-01 ENCOUNTER — Other Ambulatory Visit: Payer: Self-pay | Admitting: Internal Medicine

## 2010-12-31 ENCOUNTER — Telehealth: Payer: Self-pay | Admitting: Internal Medicine

## 2010-12-31 NOTE — Telephone Encounter (Signed)
Faxed patient's ekg to [phone] Attn: Malachi Bonds call back number is [phone]/djc

## 2011-01-01 HISTORY — PX: SHOULDER ARTHROSCOPY: SHX128

## 2011-01-09 ENCOUNTER — Ambulatory Visit: Payer: BC Managed Care – PPO

## 2011-01-10 ENCOUNTER — Ambulatory Visit (INDEPENDENT_AMBULATORY_CARE_PROVIDER_SITE_OTHER): Payer: BC Managed Care – PPO

## 2011-01-10 DIAGNOSIS — Z23 Encounter for immunization: Secondary | ICD-10-CM

## 2011-01-10 NOTE — Progress Notes (Signed)
Addended by: Vernie Murders on: 01/10/2011 03:06 PM   Modules accepted: Orders

## 2011-02-23 ENCOUNTER — Other Ambulatory Visit: Payer: Self-pay | Admitting: Internal Medicine

## 2011-03-11 ENCOUNTER — Ambulatory Visit: Payer: BC Managed Care – PPO | Admitting: Internal Medicine

## 2011-03-18 ENCOUNTER — Ambulatory Visit (INDEPENDENT_AMBULATORY_CARE_PROVIDER_SITE_OTHER): Payer: BC Managed Care – PPO | Admitting: Internal Medicine

## 2011-03-18 ENCOUNTER — Encounter: Payer: Self-pay | Admitting: Internal Medicine

## 2011-03-18 DIAGNOSIS — I10 Essential (primary) hypertension: Secondary | ICD-10-CM

## 2011-03-18 DIAGNOSIS — F329 Major depressive disorder, single episode, unspecified: Secondary | ICD-10-CM

## 2011-03-18 DIAGNOSIS — M25512 Pain in left shoulder: Secondary | ICD-10-CM

## 2011-03-18 DIAGNOSIS — G4700 Insomnia, unspecified: Secondary | ICD-10-CM

## 2011-03-18 DIAGNOSIS — M25519 Pain in unspecified shoulder: Secondary | ICD-10-CM | POA: Insufficient documentation

## 2011-03-18 DIAGNOSIS — F411 Generalized anxiety disorder: Secondary | ICD-10-CM

## 2011-03-18 NOTE — Assessment & Plan Note (Signed)
resolved 

## 2011-03-18 NOTE — Assessment & Plan Note (Signed)
Doing well off meds.

## 2011-03-18 NOTE — Assessment & Plan Note (Addendum)
Continue with current prescription therapy as reflected on the Med list - Trazodone prn Better

## 2011-03-18 NOTE — Assessment & Plan Note (Signed)
2012 injured at work Arthroscopic surgery 2012

## 2011-03-18 NOTE — Assessment & Plan Note (Signed)
Continue with current prescription therapy as reflected on the Med list.  

## 2011-03-19 NOTE — Progress Notes (Signed)
  Subjective:    Patient ID: Austin Aguilar, male    DOB: 09/06/1955, 56 y.o.   MRN: [id number]  HPI   The patient is here to follow up on chronic depression, anxiety, HTN controlled with medicines    Review of Systems  Constitutional: Negative for appetite change, fatigue and unexpected weight change.  HENT: Negative for nosebleeds, congestion, sore throat, sneezing, trouble swallowing and neck pain.   Eyes: Negative for itching and visual disturbance.  Respiratory: Negative for cough.   Cardiovascular: Negative for chest pain, palpitations and leg swelling.  Gastrointestinal: Negative for nausea, diarrhea, blood in stool and abdominal distention.  Genitourinary: Negative for frequency and hematuria.  Musculoskeletal: Negative for back pain, joint swelling and gait problem.  Skin: Negative for rash.  Neurological: Negative for dizziness, tremors, speech difficulty and weakness.  Psychiatric/Behavioral: Negative for suicidal ideas, sleep disturbance, dysphoric mood and agitation. The patient is not nervous/anxious.        Objective:   Physical Exam  Constitutional: He is oriented to person, place, and time. He appears well-developed.  HENT:  Mouth/Throat: Oropharynx is clear and moist.  Eyes: Conjunctivae are normal. Pupils are equal, round, and reactive to light.  Neck: Normal range of motion. No JVD present. No thyromegaly present.  Cardiovascular: Normal rate, regular rhythm, normal heart sounds and intact distal pulses.  Exam reveals no gallop and no friction rub.   No murmur heard. Pulmonary/Chest: Effort normal and breath sounds normal. No respiratory distress. He has no wheezes. He has no rales. He exhibits no tenderness.  Abdominal: Soft. Bowel sounds are normal. He exhibits no distension and no mass. There is no tenderness. There is no rebound and no guarding.  Musculoskeletal: Normal range of motion. He exhibits no edema and no tenderness.  Lymphadenopathy:    He has  no cervical adenopathy.  Neurological: He is alert and oriented to person, place, and time. He has normal reflexes. No cranial nerve deficit. He exhibits normal muscle tone. Coordination normal.  Skin: Skin is warm and dry. No rash noted.  Psychiatric: He has a normal mood and affect. His behavior is normal. Judgment and thought content normal.          Assessment & Plan:

## 2011-09-10 ENCOUNTER — Other Ambulatory Visit (INDEPENDENT_AMBULATORY_CARE_PROVIDER_SITE_OTHER): Payer: BC Managed Care – PPO

## 2011-09-10 DIAGNOSIS — Z0000 Encounter for general adult medical examination without abnormal findings: Secondary | ICD-10-CM

## 2011-09-10 DIAGNOSIS — I10 Essential (primary) hypertension: Secondary | ICD-10-CM

## 2011-09-10 LAB — BASIC METABOLIC PANEL
BUN: 11 mg/dL (ref 6–23)
Calcium: 9.6 mg/dL (ref 8.4–10.5)
Creatinine, Ser: 0.7 mg/dL (ref 0.4–1.5)
GFR: 124.25 mL/min (ref >60.00)

## 2011-09-10 LAB — PSA: PSA: 0.89 ng/mL (ref 0.10–4.00)

## 2011-09-16 ENCOUNTER — Ambulatory Visit (INDEPENDENT_AMBULATORY_CARE_PROVIDER_SITE_OTHER): Payer: BC Managed Care – PPO | Admitting: Internal Medicine

## 2011-09-16 ENCOUNTER — Encounter: Payer: Self-pay | Admitting: Internal Medicine

## 2011-09-16 VITALS — BP 134/88 | HR 80 | Temp 98.2°F | Resp 16 | Wt 187.0 lb

## 2011-09-16 DIAGNOSIS — K439 Ventral hernia without obstruction or gangrene: Secondary | ICD-10-CM | POA: Insufficient documentation

## 2011-09-16 DIAGNOSIS — K429 Umbilical hernia without obstruction or gangrene: Secondary | ICD-10-CM

## 2011-09-16 DIAGNOSIS — F329 Major depressive disorder, single episode, unspecified: Secondary | ICD-10-CM

## 2011-09-16 DIAGNOSIS — R739 Hyperglycemia, unspecified: Secondary | ICD-10-CM | POA: Insufficient documentation

## 2011-09-16 DIAGNOSIS — I10 Essential (primary) hypertension: Secondary | ICD-10-CM

## 2011-09-16 DIAGNOSIS — G4700 Insomnia, unspecified: Secondary | ICD-10-CM

## 2011-09-16 DIAGNOSIS — R7309 Other abnormal glucose: Secondary | ICD-10-CM

## 2011-09-16 MED ORDER — TRAZODONE HCL 50 MG PO TABS: 50.0000 mg | ORAL_TABLET | Freq: Every day | ORAL | Status: DC

## 2011-09-16 NOTE — Assessment & Plan Note (Signed)
Continue with current prescription therapy as reflected on the Med list.  

## 2011-09-16 NOTE — Progress Notes (Signed)
Patient ID: Austin Aguilar, male   DOB: [birth year], 56 y.o.   MRN: [id number]  Subjective:    Patient ID: Austin Aguilar, male    DOB: [birth year], 56 y.o.   MRN: [id number]  HPI   The patient is here to follow up on chronic depression, anxiety, HTN controlled with medicines    Review of Systems  Constitutional: Negative for appetite change, fatigue and unexpected weight change.  HENT: Negative for nosebleeds, congestion, sore throat, sneezing, trouble swallowing and neck pain.   Eyes: Negative for itching and visual disturbance.  Respiratory: Negative for cough.   Cardiovascular: Negative for chest pain, palpitations and leg swelling.  Gastrointestinal: Negative for nausea, diarrhea, blood in stool and abdominal distention.  Genitourinary: Negative for frequency and hematuria.  Musculoskeletal: Negative for back pain, joint swelling and gait problem.  Skin: Negative for rash.  Neurological: Negative for dizziness, tremors, speech difficulty and weakness.  Psychiatric/Behavioral: Negative for suicidal ideas, disturbed wake/sleep cycle, dysphoric mood and agitation. The patient is not nervous/anxious.    Wt Readings from Last 3 Encounters:  09/16/11 187 lb (84.823 kg)  03/18/11 187 lb (84.823 kg)  09/07/10 191 lb (86.637 kg)   BP Readings from Last 3 Encounters:  09/16/11 134/88  03/18/11 140/80  09/07/10 130/86         Objective:   Physical Exam  Constitutional: He is oriented to person, place, and time. He appears well-developed.  HENT:  Mouth/Throat: Oropharynx is clear and moist.  Eyes: Conjunctivae are normal. Pupils are equal, round, and reactive to light.  Neck: Normal range of motion. No JVD present. No thyromegaly present.  Cardiovascular: Normal rate, regular rhythm, normal heart sounds and intact distal pulses.  Exam reveals no gallop and no friction rub.   No murmur heard. Pulmonary/Chest: Effort normal and breath sounds normal. No respiratory distress. He  has no wheezes. He has no rales. He exhibits no tenderness.  Abdominal: Soft. Bowel sounds are normal. He exhibits no distension and no mass. There is no tenderness. There is no rebound and no guarding.  Musculoskeletal: Normal range of motion. He exhibits no edema and no tenderness.  Lymphadenopathy:    He has no cervical adenopathy.  Neurological: He is alert and oriented to person, place, and time. He has normal reflexes. No cranial nerve deficit. He exhibits normal muscle tone. Coordination normal.  Skin: Skin is warm and dry. No rash noted.  Psychiatric: He has a normal mood and affect. His behavior is normal. Judgment and thought content normal.    Lab Results  Component Value Date   WBC 7.6 09/03/2010   HGB 13.7 09/03/2010   HCT 39.0 09/03/2010   PLT 234.0 09/03/2010   GLUCOSE 104* 09/10/2011   CHOL 198 09/03/2010   TRIG 44.0 09/03/2010   HDL 56.30 09/03/2010   LDLCALC 133* 09/03/2010   ALT 28 09/03/2010   AST 29 09/03/2010   NA 142 09/10/2011   K 3.8 09/10/2011   CL 105 09/10/2011   CREATININE 0.7 09/10/2011   BUN 11 09/10/2011   CO2 29 09/10/2011   TSH 1.11 09/03/2010   PSA 0.89 09/10/2011         Assessment & Plan:

## 2011-09-16 NOTE — Assessment & Plan Note (Signed)
Doing well 

## 2011-09-16 NOTE — Assessment & Plan Note (Signed)
Mild Wt loss, diet, exercise

## 2011-09-16 NOTE — Assessment & Plan Note (Signed)
Surg cons Dr Purnell Shoemaker

## 2011-09-16 NOTE — Assessment & Plan Note (Signed)
Better on Rx 

## 2011-10-14 ENCOUNTER — Ambulatory Visit (INDEPENDENT_AMBULATORY_CARE_PROVIDER_SITE_OTHER): Payer: BC Managed Care – PPO | Admitting: Surgery

## 2011-10-14 ENCOUNTER — Encounter (INDEPENDENT_AMBULATORY_CARE_PROVIDER_SITE_OTHER): Payer: Self-pay | Admitting: Surgery

## 2011-10-14 VITALS — BP 132/80 | HR 68 | Temp 98.8°F | Resp 16 | Ht 69.0 in | Wt 184.8 lb

## 2011-10-14 DIAGNOSIS — M6208 Separation of muscle (nontraumatic), other site: Secondary | ICD-10-CM | POA: Insufficient documentation

## 2011-10-14 DIAGNOSIS — K429 Umbilical hernia without obstruction or gangrene: Secondary | ICD-10-CM

## 2011-10-14 DIAGNOSIS — K4090 Unilateral inguinal hernia, without obstruction or gangrene, not specified as recurrent: Secondary | ICD-10-CM

## 2011-10-14 DIAGNOSIS — K4020 Bilateral inguinal hernia, without obstruction or gangrene, not specified as recurrent: Secondary | ICD-10-CM | POA: Insufficient documentation

## 2011-10-14 DIAGNOSIS — R1909 Other intra-abdominal and pelvic swelling, mass and lump: Secondary | ICD-10-CM | POA: Insufficient documentation

## 2011-10-14 NOTE — Progress Notes (Signed)
Subjective:     Patient ID: Austin Aguilar, male   DOB: 07-Jun-1955, 56 y.o.   MRN: [id number]  HPI  Jovanni Rash [facility]  02/13/1956 [id number]  Patient Care Team: Tresa Garter, MD as PCP - General (Internal Medicine)  This patient is a 56 y.o.male who presents today for surgical evaluation at the request of Dr. Posey Rea.   Reason for visit: Umbilical swelling.  Probable hernia.  Patient is a pleasant active male.  He's noticed a lump in his bellybutton for the past two years.  It became more bothersome this year.  He often runs and is rather physically active.  He's noticed more pain and soreness with bending and running and twisting.  It is affecting his ability to exercise.  Based on concerns, to his primary care physician, Dr. Posey Rea, felt that surgery may be warranted and sent him to [place] for evaluation.  Patient recalls having a hydrocele removed in his left groin when he was in his 19s.  He is occasionally noted some discomfort in his right groin also on later discussion.  Has daily bowel movements.  Had a normal colonoscopy.  He's on his feet all day and can walk several miles normally.  It hurts to run at his bellybutton but no history of heart or lung problems  Patient Active Problem List  Diagnosis  . ANXIETY  . INSOMNIA, CHRONIC  . HYPERTENSION  . DIVERTICULOSIS, COLON  . PROSTATITIS, ACUTE  . PLANTAR FASCIITIS  . CHEST DISCOMFORT  . PELVIC PAIN, ACUTE  . TOBACCO USE, QUIT  . Well adult exam  . Shoulder pain, left  . Hyperglycemia  . Umbilical hernia  . Inguinal hernia, right  . Groin mass on left (old hydrocele vs recurrent hernia  . Diastasis recti    Past Medical History  Diagnosis Date  . Diverticulosis of colon   . Anxiety   . Depression     Dr Debbe Bales - psychiatrist  . HTN (hypertension) 2011    Past Surgical History  Procedure Date  . Shoulder arthroscopy 01/01/11  . Hernia repair 1978    History   Social History  . Marital Status:  Married    Spouse Name: N/A    Number of Children: N/A  . Years of Education: N/A   Occupational History  . 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 Smoking status: Former Games developer  . Smokeless tobacco: Not on file   Comment: quit 1990's/2nd hand exposure from wife (min)  . Alcohol Use: 1.8 oz/week    3 Cans of beer per week  . Drug Use: No  . Sexually Active: Yes   Other Topics Concern  . Not on file   Social History Narrative   Regular exercise - YES, running    Family History  Problem Relation Age of Onset  . Hypertension Mother   . Hyperlipidemia Mother   . Stroke Mother   . Coronary artery disease Father     ?CABG   . COPD Brother   . Hypertension Brother   . Heart disease Brother   . Coronary artery disease Brother   . Alcohol abuse Brother   . Drug abuse Brother   . Alcohol abuse Sister   . Drug abuse Sister     Current Outpatient Prescriptions  Medication Sig Dispense Refill  . amLODipine (NORVASC) 5 MG tablet TAKE 1 TABLET BY MOUTH EVERY DAY  90 tablet  2  .  Ascorbic Acid (VITAMIN C) 1000 MG tablet Take 1,000 mg by mouth daily.        Marland Kitchen aspirin 81 MG EC tablet Take 81 mg by mouth daily.        . fish oil-omega-3 fatty acids 1000 MG capsule Take 1 g by mouth daily.        Marland Kitchen losartan (COZAAR) 100 MG tablet 1 BY MOUTH ONCE DAILY FOR BLOOD PRESSURE  30 tablet  11  . Multiple Vitamin (MULTIVITAMIN) tablet Take 1 tablet by mouth daily.        . traZODone (DESYREL) 50 MG tablet Take 1 tablet (50 mg total) by mouth at bedtime.  90 tablet  1     No Known Allergies  BP 132/80  Pulse 68  Temp 98.8 F (37.1 C) (Temporal)  Resp 16  Ht 5\' 9"  (1.753 m)  Wt 184 lb 12.8 oz (83.825 kg)  BMI 27.29 kg/m2  No results found.   Review of Systems  Constitutional: Negative for fever, chills and diaphoresis.  HENT: Negative for nosebleeds, sore throat, facial swelling, mouth sores, trouble swallowing and ear discharge.   Eyes:  Negative for photophobia, discharge and visual disturbance.  Respiratory: Negative for choking, chest tightness, shortness of breath and stridor.   Cardiovascular: Negative for chest pain and palpitations.       Patient walks 60 minutes for about 2 miles without difficulty.  No exertional chest/neck/shoulder/arm pain.   Gastrointestinal: Positive for abdominal pain. Negative for nausea, vomiting, diarrhea, constipation, blood in stool, abdominal distention, anal bleeding and rectal pain.  Genitourinary: Negative for dysuria, urgency, difficulty urinating and testicular pain.  Musculoskeletal: Negative for myalgias, back pain, arthralgias and gait problem.  Skin: Negative for color change, pallor, rash and wound.  Neurological: Negative for dizziness, speech difficulty, weakness, numbness and headaches.  Hematological: Negative for adenopathy. Does not bruise/bleed easily.  Psychiatric/Behavioral: Negative for hallucinations, confusion and agitation.       Objective:   Physical Exam  Constitutional: He is oriented to person, place, and time. He appears well-developed and well-nourished. No distress.  HENT:  Head: Normocephalic.  Mouth/Throat: Oropharynx is clear and moist. No oropharyngeal exudate.  Eyes: Conjunctivae and EOM are normal. Pupils are equal, round, and reactive to light. No scleral icterus.  Neck: Normal range of motion. Neck supple. No tracheal deviation present.  Cardiovascular: Normal rate, regular rhythm and intact distal pulses.   Pulmonary/Chest: Effort normal and breath sounds normal. No respiratory distress.  Abdominal: Soft. He exhibits no distension. There is no tenderness. A hernia is present. Hernia confirmed positive in the ventral area and confirmed positive in the right inguinal area.    Musculoskeletal: Normal range of motion. He exhibits no tenderness.  Lymphadenopathy:    He has no cervical adenopathy.       Right: No inguinal adenopathy present.        Left: No inguinal adenopathy present.  Neurological: He is alert and oriented to person, place, and time. No cranial nerve deficit. He exhibits normal muscle tone. Coordination normal.  Skin: Skin is warm and dry. No rash noted. He is not diaphoretic. No erythema. No pallor.  Psychiatric: He has a normal mood and affect. His behavior is normal. Judgment and thought content normal.       Assessment:     Umbilical hernia in the setting of diastases recti and a moderately active male.  Bilateral groin masses.  Definite right inguinal hernia.  Probable old hydrocele on the left groin vs  possible recurrent left inguinal hernia as well    Plan:     I feel that the umbilical hernia needs to be repaired.  Given its moderate size and in the setting of diastases, I think he would benefit from laparoscopic underlay repair.  Laparoscopy would also help me have a chance to look in the groins and see if he has bilateral recurrences are just unilateral.  I also recommended repair of at least the right groin and possibly left if there is hernia repair as well.  Another option is to consider an ultrasound of the groins but I don't think it would change my operative approach at this moment.  He wants to coordinate his to minimize his chance off work off.  I cautioned him given his intense activity level that he will be out at least a couple weeks, possibly longer.  He was concerned about that but expressed understanding.  I did discuss the procedure with him:  The anatomy & physiology of the abdominal wall & inguinal region were discussed.  The pathophysiology of hernias was discussed.  Natural history risks without surgery including progeressive enlargement, pain, incarceration & strangulation was discussed.   Contributors to complications such as smoking, obesity, diabetes, prior surgery, etc were discussed.   I feel the risks of no intervention will lead to serious problems that outweigh the operative risks;  therefore, I recommended surgery to reduce and repair the hernias.  I explained laparoscopic techniques with possible need for an open approach.  I noted the probable use of mesh to patch and/or buttress the hernia repair  Risks such as bleeding, infection, abscess, need for further treatment, heart attack, death, and other risks were discussed.  I noted a good likelihood this will help address the problem.   Goals of post-operative recovery were discussed as well.  Possibility that this will not correct all symptoms was explained.  I stressed the importance of low-impact activity, aggressive pain control, avoiding constipation, & not pushing through pain to minimize risk of post-operative chronic pain or injury. Possibility of reherniation especially with smoking, obesity, diabetes, immunosuppression, and other health conditions was discussed.  We will work to minimize complications.     An educational handout further explaining the pathology & treatment options was given as well.  Questions were answered.  The patient expresses understanding & wishes to proceed with surgery.

## 2011-10-14 NOTE — Patient Instructions (Addendum)
See the Handout(s) we gave you.  Consider surgery.  Please call our office at [phone] if you wish to schedule surgery or if you have further questions / concerns.   Hernia, Surgical Repair A hernia occurs when an internal organ pushes out through a weak spot in the belly (abdominal) wall muscles. Hernias commonly occur in the groin and around the navel. Hernias often can be pushed back into place (reduced). Most hernias tend to get worse over time. Problems occur when abdominal contents get stuck in the opening (incarcerated hernia). The blood supply gets cut off (strangulated hernia). This is an emergency and needs surgery. Otherwise, hernia repair can be an elective procedure. This means you can schedule this at your convenience when an emergency is not present. Because complications can occur, if you decide to repair the hernia, it is best to do it soon. When it becomes an emergency procedure, there is increased risk of complications after surgery. CAUSES   Heavy lifting.   Obesity.   Prolonged coughing.   Straining to move your bowels.   Hernias can also occur through a cut (incision) by a surgeonafter an abdominal operation.  HOME CARE INSTRUCTIONS Before the repair:  Bed rest is not required. You may continue your normal activities, but avoid heavy lifting (more than 10 pounds) or straining. Cough gently. If you are a smoker, it is best to stop. Even the best hernia repair can break down with the continual strain of coughing.   Do not wear anything tight over your hernia. Do not try to keep it in with an outside bandage or truss. These can damage abdominal contents if they are trapped in the hernia sac.   Eat a normal diet. Avoid constipation. Straining over long periods of time to have a bowel movement will increase hernia size. It also can breakdown repairs. If you cannot do this with diet alone, laxatives or stool softeners may be used.  PRIOR TO SURGERY, SEEK IMMEDIATE  MEDICAL CARE IF: You have problems (symptoms) of a trapped (incarcerated) hernia. Symptoms include:  An oral temperature above 102 F (38.9 C) develops, or as your caregiver suggests.   Increasing abdominal pain.   Feeling sick to your stomach(nausea) and vomiting.   You stop passing gas or stool.   The hernia is stuck outside the abdomen, looks discolored, feels hard, or is tender.   You have any changes in your bowel habits or in the hernia that is unusual for you.  LET YOUR CAREGIVERS KNOW ABOUT THE FOLLOWING:  Allergies.   Medications taken including herbs, eye drops, over the counter medications, and creams.   Use of steroids (by mouth or creams).   Family or personal history of problems with anesthetics or Novocaine.   Possibility of pregnancy, if this applies.   Personal history of blood clots (thrombophlebitis).   Family or personal history of bleeding or blood problems.   Previous surgery.   Other health problems.  BEFORE THE PROCEDURE You should be present 1 hour prior to your procedure, or as directed by your caregiver.  AFTER THE PROCEDURE After surgery, you will be taken to the recovery area. A nurse will watch and check your progress there. Once you are awake, stable, and taking fluids well, you will be allowed to go home as long as there are no problems. Once home, an ice pack (wrapped in a light towel) applied to your operative site may help with discomfort. It may also keep the swelling down. Do  not lift anything heavier than 10 pounds (4.55 kilograms). Take showers not baths. Do not drive while taking narcotics. Follow instructions as suggested by your caregiver.  SEEK IMMEDIATE MEDICAL CARE IF: After surgery:  There is redness, swelling, or increasing pain in the wound.   There is pus coming from the wound.   There is drainage from a wound lasting longer than 1 day.   An unexplained oral temperature above 102 F (38.9 C) develops.   You notice a  foul smell coming from the wound or dressing.   There is a breaking open of a wound (edged not staying together) after the sutures have been removed.   You notice increasing pain in the shoulders (shoulder strap areas).   You develop dizzy episodes or fainting while standing.   You develop persistent nausea or vomiting.   You develop a rash.   You have difficulty breathing.   You develop any reaction or side effects to medications given.  MAKE SURE YOU:   Understand these instructions.   Will watch your condition.   Will get help right away if you are not doing well or get worse.  Document Released: 08/28/2000 Document Revised: 02/21/2011 Document Reviewed: 07/21/2007 [facility] Patient Information 2012 [place], [place].

## 2011-11-06 NOTE — Progress Notes (Signed)
Dr; Michaell Cowing: When you can, we need orders on Austin Aguilar (DOS 11-21-11) He is coming for preop 11-19-11 Thank you  [facility] PST

## 2011-11-07 ENCOUNTER — Other Ambulatory Visit (INDEPENDENT_AMBULATORY_CARE_PROVIDER_SITE_OTHER): Payer: Self-pay | Admitting: Surgery

## 2011-11-11 ENCOUNTER — Encounter (HOSPITAL_COMMUNITY): Payer: Self-pay | Admitting: Pharmacy Technician

## 2011-11-19 ENCOUNTER — Encounter (HOSPITAL_COMMUNITY)
Admission: RE | Admit: 2011-11-19 | Discharge: 2011-11-19 | Disposition: A | Discharge status: Home / Self Care | Payer: BC Managed Care – PPO | Source: Ambulatory Visit | Attending: Surgery | Admitting: Surgery

## 2011-11-19 ENCOUNTER — Encounter (HOSPITAL_COMMUNITY): Payer: Self-pay

## 2011-11-19 ENCOUNTER — Ambulatory Visit (HOSPITAL_COMMUNITY)
Admission: RE | Admit: 2011-11-19 | Discharge: 2011-11-19 | Disposition: A | Discharge status: Home / Self Care | Payer: BC Managed Care – PPO | Source: Ambulatory Visit | Attending: Surgery | Admitting: Surgery

## 2011-11-19 DIAGNOSIS — K439 Ventral hernia without obstruction or gangrene: Secondary | ICD-10-CM | POA: Insufficient documentation

## 2011-11-19 DIAGNOSIS — Z01810 Encounter for preprocedural cardiovascular examination: Secondary | ICD-10-CM | POA: Insufficient documentation

## 2011-11-19 DIAGNOSIS — E119 Type 2 diabetes mellitus without complications: Secondary | ICD-10-CM | POA: Insufficient documentation

## 2011-11-19 DIAGNOSIS — I10 Essential (primary) hypertension: Secondary | ICD-10-CM | POA: Insufficient documentation

## 2011-11-19 DIAGNOSIS — Z01812 Encounter for preprocedural laboratory examination: Secondary | ICD-10-CM | POA: Insufficient documentation

## 2011-11-19 LAB — BASIC METABOLIC PANEL
BUN: 12 mg/dL (ref 6–23)
Calcium: 9.8 mg/dL (ref 8.4–10.5)
GFR calc Af Amer: >90 mL/min (ref >90)
GFR calc non Af Amer: >90 mL/min (ref >90)
Potassium: 4 mEq/L (ref 3.5–5.1)
Sodium: 138 mEq/L (ref 135–145)

## 2011-11-19 LAB — CBC
MCH: 32 pg (ref 26.0–34.0)
MCHC: 35.1 g/dL (ref 30.0–36.0)
RDW: 11.6 % (ref 11.5–15.5)

## 2011-11-19 LAB — SURGICAL PCR SCREEN
MRSA, PCR: NEGATIVE
Staphylococcus aureus: POSITIVE — AB

## 2011-11-19 NOTE — Patient Instructions (Addendum)
20 Austin Aguilar  11/19/2011   Your procedure is scheduled on:  11/21/11  Thursday  Surgery [id number]  Report to [facility] at     0900  AM.  Call this number if you have problems the morning of surgery: [phone]     Or PST   [phone]  [place] [facility]   Remember:   Do not eat food or drink any fluids :After Midnight. Wednesday NIGHT   Take these medicines the morning of surgery with A SIP OF WATER:  AMLODIPINE   Do not wear jewelry, make-up or nail polish.  Do not wear lotions, powders, or perfumes. You may wear deodorant.  Do not shave 48 hours prior to surgery.  Do not bring valuables to the hospital.  Contacts, dentures or bridgework may not be worn into surgery.  Leave suitcase in the car. After surgery it may be brought to your room.  For patients admitted to the hospital, checkout time is 11:00 AM the day of discharge.   Patients discharged the day of surgery will not be allowed to drive home.  Name and phone number of your driver:   wife                                                                   Special Instructions: CHG Shower Use Special Wash: 1/2 bottle night before surgery and 1/2 bottle morning of surgery. REGULAR SOAP FACE AND PRIVATES               MEN  MAY SHAVE FACE Please read over the following fact sheets that you were given: MRSA Information

## 2011-11-20 NOTE — Pre-Procedure Instructions (Signed)
11-20-11 0835 Pt. Notified of positive Staph aureus PCR screen-will use Mupirocin Ointment as directed. And bring to hospital.W. Angello Chien,RN

## 2011-11-21 ENCOUNTER — Ambulatory Visit (HOSPITAL_COMMUNITY)
Admission: RE | Admit: 2011-11-21 | Discharge: 2011-11-21 | Disposition: A | Discharge status: Home / Self Care | Payer: BC Managed Care – PPO | Source: Ambulatory Visit | Attending: Surgery | Admitting: Surgery

## 2011-11-21 ENCOUNTER — Ambulatory Visit (HOSPITAL_COMMUNITY): Payer: BC Managed Care – PPO | Admitting: Anesthesiology

## 2011-11-21 ENCOUNTER — Encounter (HOSPITAL_COMMUNITY): Payer: Self-pay | Admitting: *Deleted

## 2011-11-21 ENCOUNTER — Encounter (HOSPITAL_COMMUNITY)
Admission: RE | Disposition: A | Discharge status: Home / Self Care | Payer: Self-pay | Source: Ambulatory Visit | Attending: Surgery

## 2011-11-21 ENCOUNTER — Encounter (HOSPITAL_COMMUNITY): Payer: Self-pay | Admitting: Anesthesiology

## 2011-11-21 DIAGNOSIS — Z7982 Long term (current) use of aspirin: Secondary | ICD-10-CM | POA: Insufficient documentation

## 2011-11-21 DIAGNOSIS — K4091 Unilateral inguinal hernia, without obstruction or gangrene, recurrent: Secondary | ICD-10-CM

## 2011-11-21 DIAGNOSIS — K429 Umbilical hernia without obstruction or gangrene: Secondary | ICD-10-CM | POA: Insufficient documentation

## 2011-11-21 DIAGNOSIS — I10 Essential (primary) hypertension: Secondary | ICD-10-CM | POA: Insufficient documentation

## 2011-11-21 DIAGNOSIS — K4090 Unilateral inguinal hernia, without obstruction or gangrene, not specified as recurrent: Secondary | ICD-10-CM

## 2011-11-21 DIAGNOSIS — M6200 Separation of muscle (nontraumatic), unspecified site: Secondary | ICD-10-CM | POA: Insufficient documentation

## 2011-11-21 DIAGNOSIS — K439 Ventral hernia without obstruction or gangrene: Secondary | ICD-10-CM

## 2011-11-21 DIAGNOSIS — D176 Benign lipomatous neoplasm of spermatic cord: Secondary | ICD-10-CM | POA: Insufficient documentation

## 2011-11-21 DIAGNOSIS — Z79899 Other long term (current) drug therapy: Secondary | ICD-10-CM | POA: Insufficient documentation

## 2011-11-21 DIAGNOSIS — K4021 Bilateral inguinal hernia, without obstruction or gangrene, recurrent: Secondary | ICD-10-CM | POA: Insufficient documentation

## 2011-11-21 HISTORY — PX: INGUINAL HERNIA REPAIR: SHX194

## 2011-11-21 HISTORY — PX: VENTRAL HERNIA REPAIR: SHX424

## 2011-11-21 SURGERY — REPAIR, HERNIA, VENTRAL, LAPAROSCOPIC
Anesthesia: General | Site: Groin

## 2011-11-21 MED ORDER — ACETAMINOPHEN 10 MG/ML IV SOLN
INTRAVENOUS | Status: DC | PRN
  Administered 2011-11-21: 1000 mg via INTRAVENOUS

## 2011-11-21 MED ORDER — PROPOFOL 10 MG/ML IV BOLUS
INTRAVENOUS | Status: DC | PRN
  Administered 2011-11-21: 200 mg via INTRAVENOUS

## 2011-11-21 MED ORDER — KETOROLAC TROMETHAMINE 30 MG/ML IJ SOLN
INTRAMUSCULAR | Status: DC | PRN
  Administered 2011-11-21: 30 mg via INTRAVENOUS

## 2011-11-21 MED ORDER — ROCURONIUM BROMIDE 100 MG/10ML IV SOLN
INTRAVENOUS | Status: DC | PRN
  Administered 2011-11-21: 50 mg via INTRAVENOUS
  Administered 2011-11-21 (×3): 15 mg via INTRAVENOUS

## 2011-11-21 MED ORDER — FENTANYL CITRATE 0.05 MG/ML IJ SOLN
INTRAMUSCULAR | Status: DC | PRN
  Administered 2011-11-21: 50 ug via INTRAVENOUS
  Administered 2011-11-21: 100 ug via INTRAVENOUS
  Administered 2011-11-21: 50 ug via INTRAVENOUS
  Administered 2011-11-21: 100 ug via INTRAVENOUS
  Administered 2011-11-21 (×2): 50 ug via INTRAVENOUS

## 2011-11-21 MED ORDER — DEXTROSE 5 % IV SOLN
3.0000 g | INTRAVENOUS | Status: DC
  Filled 2011-11-21: qty 3000

## 2011-11-21 MED ORDER — NEOSTIGMINE METHYLSULFATE 1 MG/ML IJ SOLN
INTRAMUSCULAR | Status: DC | PRN
  Administered 2011-11-21: 4 mg via INTRAVENOUS

## 2011-11-21 MED ORDER — MIDAZOLAM HCL 5 MG/5ML IJ SOLN
INTRAMUSCULAR | Status: DC | PRN
  Administered 2011-11-21: 2 mg via INTRAVENOUS

## 2011-11-21 MED ORDER — PROMETHAZINE HCL 25 MG/ML IJ SOLN: 6.2500 mg | INTRAMUSCULAR | Status: DC | PRN

## 2011-11-21 MED ORDER — HYDROMORPHONE HCL PF 1 MG/ML IJ SOLN
0.2500 mg | INTRAMUSCULAR | Status: DC | PRN
  Administered 2011-11-21 (×2): 0.5 mg via INTRAVENOUS

## 2011-11-21 MED ORDER — LABETALOL HCL 5 MG/ML IV SOLN
INTRAVENOUS | Status: DC | PRN
  Administered 2011-11-21: 2.5 mg via INTRAVENOUS
  Administered 2011-11-21: 5 mg via INTRAVENOUS

## 2011-11-21 MED ORDER — GLYCOPYRROLATE 0.2 MG/ML IJ SOLN
INTRAMUSCULAR | Status: DC | PRN
  Administered 2011-11-21: .6 mg via INTRAVENOUS

## 2011-11-21 MED ORDER — STERILE WATER FOR IRRIGATION IR SOLN
Status: DC | PRN
  Administered 2011-11-21: 1000 mL

## 2011-11-21 MED ORDER — LACTATED RINGERS IR SOLN
Status: DC | PRN
  Administered 2011-11-21: 1000 mL

## 2011-11-21 MED ORDER — BUPIVACAINE-EPINEPHRINE 0.25% -1:200000 IJ SOLN
INTRAMUSCULAR | Status: DC | PRN
  Administered 2011-11-21: 80 mL

## 2011-11-21 MED ORDER — ACETAMINOPHEN 10 MG/ML IV SOLN
INTRAVENOUS | Status: AC
  Filled 2011-11-21: qty 100

## 2011-11-21 MED ORDER — CEFAZOLIN SODIUM-DEXTROSE 2-3 GM-% IV SOLR
2.0000 g | INTRAVENOUS | Status: AC
  Administered 2011-11-21: 2 g via INTRAVENOUS

## 2011-11-21 MED ORDER — LACTATED RINGERS IV SOLN
INTRAVENOUS | Status: DC
  Administered 2011-11-21: 13:00:00 via INTRAVENOUS
  Administered 2011-11-21: 1000 mL via INTRAVENOUS
  Administered 2011-11-21: 12:00:00 via INTRAVENOUS

## 2011-11-21 MED ORDER — BUPIVACAINE-EPINEPHRINE PF 0.25-1:200000 % IJ SOLN
INTRAMUSCULAR | Status: AC
  Filled 2011-11-21: qty 30

## 2011-11-21 MED ORDER — HYDROMORPHONE HCL PF 1 MG/ML IJ SOLN
INTRAMUSCULAR | Status: AC
  Filled 2011-11-21: qty 1

## 2011-11-21 MED ORDER — EPHEDRINE SULFATE 50 MG/ML IJ SOLN
INTRAMUSCULAR | Status: DC | PRN
  Administered 2011-11-21: 5 mg via INTRAVENOUS
  Administered 2011-11-21: 10 mg via INTRAVENOUS

## 2011-11-21 MED ORDER — CEFAZOLIN SODIUM-DEXTROSE 2-3 GM-% IV SOLR
INTRAVENOUS | Status: AC
  Filled 2011-11-21: qty 50

## 2011-11-21 MED ORDER — OXYCODONE HCL 5 MG PO TABS: 5.0000 mg | ORAL_TABLET | ORAL | Status: AC | PRN

## 2011-11-21 MED ORDER — KETOROLAC TROMETHAMINE 30 MG/ML IJ SOLN: 15.0000 mg | Freq: Once | INTRAMUSCULAR | Status: DC | PRN

## 2011-11-21 MED ORDER — ESMOLOL HCL 10 MG/ML IV SOLN
INTRAVENOUS | Status: DC | PRN
  Administered 2011-11-21: 30 mg via INTRAVENOUS

## 2011-11-21 SURGICAL SUPPLY — 57 items
APPLIER CLIP 5 13 M/L LIGAMAX5 (MISCELLANEOUS)
APR CLP MED LRG 5 ANG JAW (MISCELLANEOUS)
BINDER ABD UNIV 12 45-62 (WOUND CARE) IMPLANT
BINDER ABDOMINAL 46IN 62IN (WOUND CARE)
CANISTER SUCTION 2500CC (MISCELLANEOUS) ×3 IMPLANT
CHLORAPREP W/TINT 26ML (MISCELLANEOUS) ×3 IMPLANT
CLIP APPLIE 5 13 M/L LIGAMAX5 (MISCELLANEOUS) IMPLANT
CLOTH BEACON ORANGE TIMEOUT ST (SAFETY) ×3 IMPLANT
DECANTER SPIKE VIAL GLASS SM (MISCELLANEOUS) ×3 IMPLANT
DEVICE SECURE STRAP 25 ABSORB (INSTRUMENTS) ×2 IMPLANT
DEVICE TROCAR PUNCTURE CLOSURE (ENDOMECHANICALS) IMPLANT
DISSECTOR BLUNT TIP ENDO 5MM (MISCELLANEOUS) IMPLANT
DRAPE LAPAROSCOPIC ABDOMINAL (DRAPES) ×3 IMPLANT
DRAPE WARM FLUID 44X44 (DRAPE) ×3 IMPLANT
DRSG TEGADERM 2-3/8X2-3/4 SM (GAUZE/BANDAGES/DRESSINGS) ×9 IMPLANT
DRSG TEGADERM 4X4.75 (GAUZE/BANDAGES/DRESSINGS) ×3 IMPLANT
ELECT REM PT RETURN 9FT ADLT (ELECTROSURGICAL) ×3
ELECTRODE REM PT RTRN 9FT ADLT (ELECTROSURGICAL) ×2 IMPLANT
FILTER SMOKE EVAC LAPAROSHD (FILTER) IMPLANT
GAUZE SPONGE 2X2 8PLY STRL LF (GAUZE/BANDAGES/DRESSINGS) IMPLANT
GLOVE BIOGEL PI IND STRL 7.0 (GLOVE) ×2 IMPLANT
GLOVE BIOGEL PI INDICATOR 7.0 (GLOVE) ×1
GLOVE ECLIPSE 8.0 STRL XLNG CF (GLOVE) ×3 IMPLANT
GLOVE INDICATOR 8.0 STRL GRN (GLOVE) ×6 IMPLANT
GOWN STRL NON-REIN LRG LVL3 (GOWN DISPOSABLE) ×3 IMPLANT
GOWN STRL REIN XL XLG (GOWN DISPOSABLE) ×6 IMPLANT
HAND ACTIVATED (MISCELLANEOUS) IMPLANT
KIT BASIN OR (CUSTOM PROCEDURE TRAY) ×3 IMPLANT
MESH PHYSIO OVAL 10X15CM (Mesh General) ×2 IMPLANT
MESH ULTRAPRO 6X6 15CM15CM (Mesh General) ×4 IMPLANT
NDL INSUFFLATION 14GA 120MM (NEEDLE) IMPLANT
NDL SPNL 22GX3.5 QUINCKE BK (NEEDLE) IMPLANT
NEEDLE INSUFFLATION 14GA 120MM (NEEDLE) IMPLANT
NEEDLE SPNL 22GX3.5 QUINCKE BK (NEEDLE) IMPLANT
NS IRRIG 1000ML POUR BTL (IV SOLUTION) ×3 IMPLANT
PEN SKIN MARKING BROAD (MISCELLANEOUS) ×3 IMPLANT
PENCIL BUTTON HOLSTER BLD 10FT (ELECTRODE) IMPLANT
SCISSORS LAP 5X35 DISP (ENDOMECHANICALS) ×2 IMPLANT
SET IRRIG TUBING LAPAROSCOPIC (IRRIGATION / IRRIGATOR) IMPLANT
SLEEVE Z-THREAD 5X100MM (TROCAR) ×6 IMPLANT
SPONGE GAUZE 2X2 STER 10/PKG (GAUZE/BANDAGES/DRESSINGS)
STRIP CLOSURE SKIN 1/2X4 (GAUZE/BANDAGES/DRESSINGS) ×1 IMPLANT
SUT MNCRL AB 4-0 PS2 18 (SUTURE) ×3 IMPLANT
SUT PROLENE 1 CT 1 30 (SUTURE) IMPLANT
SUT PROLENE 1 CTX 30  8455H (SUTURE) ×5
SUT PROLENE 1 CTX 30 8455H (SUTURE) ×5 IMPLANT
SUT VIC AB 2-0 UR6 27 (SUTURE) ×1 IMPLANT
TACKER 5MM HERNIA 3.5CML NAB (ENDOMECHANICALS) IMPLANT
TOWEL OR 17X26 10 PK STRL BLUE (TOWEL DISPOSABLE) ×3 IMPLANT
TRAY FOLEY CATH 14FRSI W/METER (CATHETERS) ×1 IMPLANT
TRAY LAP CHOLE (CUSTOM PROCEDURE TRAY) ×3 IMPLANT
TROCAR BLADELESS OPT 5 75 (ENDOMECHANICALS) IMPLANT
TROCAR XCEL BLADELESS 5X75MML (TROCAR) ×3 IMPLANT
TROCAR XCEL BLUNT TIP 100MML (ENDOMECHANICALS) ×3 IMPLANT
TROCAR Z-THREAD FIOS 11X100 BL (TROCAR) ×2 IMPLANT
TROCAR Z-THREAD FIOS 5X100MM (TROCAR) ×2 IMPLANT
TUBING INSUFFLATION 10FT LAP (TUBING) ×3 IMPLANT

## 2011-11-21 NOTE — Progress Notes (Signed)
Pt states he feels much better, nausea has resolved and states he is ready to go home.

## 2011-11-21 NOTE — Op Note (Signed)
11/21/2011  2:34 PM  PATIENT:  Austin Aguilar  56 y.o. male  Patient Care Team: Tresa Garter, MD as PCP - General (Internal Medicine)  PRE-OPERATIVE DIAGNOSIS:  ventral wall umbilical  hernia, right inguinal hernia  POST-OPERATIVE DIAGNOSIS:    UMBILICAL VENTRAL WALL HERNIA RIGHT INGUINAL HERNIA  LEFT RECURRENT INGUINAL HERNIA  PROCEDURE:  Procedure(s): LAPAROSCOPIC VENTRAL HERNIA repair w mesh LAPAROSCOPIC INGUINAL HERNIA bilateral w mesh EXCISION of spermatic cord lipomas  SURGEON:  Surgeon(s): Ardeth Sportsman, MD  ASSISTANT: none   ANESTHESIA:   local and general  EBL:  Total I/O In: 2000 [I.V.:2000] Out: 300 [Urine:200; Blood:100]  Delay start of Pharmacological VTE agent (>24hrs) due to surgical blood loss or risk of bleeding:  no  DRAINS: none   SPECIMEN:  No Specimen  DISPOSITION OF SPECIMEN:  N/A  COUNTS:  YES  PLAN OF CARE: Discharge to home after PACU  PATIENT DISPOSITION:  PACU - hemodynamically stable.  INDICATION:   OR FINDINGS: Moderate size indirect right inguinal hernia.  Old direct hernia repair on left side.  New indirect hernia on left side.  2-1/2 cm periumbilical ventral hernia defect.  Not incarcerated.  DESCRIPTION:   The patient was identified & brought into the operating room. The patient was positioned supine with arms tucked. SCDs were active during the entire case. The patient underwent general anesthesia without any difficulty.  The abdomen was prepped and draped in a sterile fashion. The patient's bladder was emptied.  A Surgical Timeout confirmed our plan.  I made a transverse incision through the inferior umbilical fold.  I made a small transverse nick through the anterior rectus fascia contralateral to the inguinal hernia side and placed a 0-vicryl stitch through the fascia.  I placed a Hasson trocar into the preperitoneal plane.  Entry was clean.  We induced carbon dioxide insufflation. Camera inspection revealed no  injury.  I used a 10mm angled scope to bluntly free the peritoneum off the infraumbilical anterior abdominal wall.  I created enough of a preperitoneal pocket to place 5mm ports into the right & left mid-abdomen into this preperitoneal cavity.  I focused attention on the right side since that was the dominant hernia side.   I used blunt & focused sharp dissection to free the peritoneum off the flank and down to the pubic rim.  I freed the anteriolateral bladder wall off the anteriolateral pelvic wall, sparing midline attachments.   I located a swath of peritoneum going into a fascial defect at the internal ring consistent with an indirect inguinal hernia.  I gradually better freed it off safely and reduced it completely.  I freed off a large spermatic cord lipoma.  I morcellated and removed it.  I freed the peritoneum off the spermatic vessels & vas deferens.  I freed peritoneum off the retroperitoneum along the psoas muscle.    I checked & assured hemostasis.    I turned attention on the opposite side.  I did dissection in a similar, mirror-image fashion. There was a little bit of scarring on the medial part of the direct space.  Consistent with old mesh.  I did not find a hernia recurrence there.  The patient had a dilated internal ring consistent with an indirect hernia.  Again a large cord lipoma was found, reduced, morcellated, and removed   There were no breaches in peritoneum that required closure.  I chose 15x15 cm sheets of ultra-lightweight polypropylene mesh (Ultrapro), one for each side.  I cut  a sigmoid-shaped slit on a side of each mesh.  I placed the meshes into the preperitoneal space & laid them as overlapping diamonds such that at the inferior points  a 6x6 cm corner flap rested in the true anterolateral pelvis, covering the obturator & femoral foramina.   I allowed the bladder to fall back and help the corners of the mesh in.  The medial corners overlapped each other across midline cephalad  to the pubic rim.   This provided >2 inch coverage around the hernia.  Because the defects well covered and not particularly large, I did not need tacks to hold the mesh in place  I held the hernia sacs cephalad & evacuated carbon dioxide.  I then opened up the umbilical hernia sac.  I directed the 12 minute port through this.  I induced intraperitoneal carbon dioxide insufflation.  I redirected the 5 mm ports into the abdomen.  I later removed the right-sided port into the left upper quadrant.  This was all done under laparoscopic visualization.  I could see the umbilical ventral hernia defect.  It was 2 x 2.5 centimeters.Some diastases associated with it.  No other hernias noted.  I chose a 15 x 10 cm Physiomesh (ultralight polypropylene/Monocryl covering).  I placed it into the peritoneal cavity and secured it to the anterior abdominal wall through transfacial #1 Prolene interrupted stitches around the perforated x10.  This provided 4- 5 cm coverage around the hernia defect.  I used a secure strap absorbable tacker to tack the mesh around the periphery and centrally.  Also placed a few tacks to help retract the peritoneum down to the right lower quadrant and help secure the mesh in that way on the bilateral inguinal hernia repairs.  Hemostasis was excellent.  I closed the umbilcial ventral wall hernia using interrupted 0-vicryl stitches transversely.  I reinspected laparoscopically and noted no bowel injury or other abnormality.  Stenosis was excellent.  I evacuated carbon dioxide. I closed the skin using 4-0 monocryl stitch.  Sterile dressings were applied. The patient was extubated & arrived in the PACU in stable condition..  I had discussed postoperative care with the patient in the holding area.   I am about to discuss operative findings and postoperative goals / instructions to family as well.  Instructions are written in the chart.

## 2011-11-21 NOTE — Progress Notes (Signed)
One of the lower abd incisions bleeding/minimal oozing,  so gauze applied over steristrip for support.  Gauze and paper tape sent home with pt if additional support needed.

## 2011-11-21 NOTE — Transfer of Care (Signed)
Immediate Anesthesia Transfer of Care Note  Patient: Austin Aguilar  Procedure(s) Performed: Procedure(s) (LRB) with comments: LAPAROSCOPIC VENTRAL HERNIA (N/A) - laparoscopic ventral wall hernia repair LAPAROSCOPIC INGUINAL HERNIA (Bilateral) - BILATERAL INGUINAL HERNIA REPAIRS LAPARASCOPIC  Patient Location: PACU  Anesthesia Type: General  Level of Consciousness: awake, oriented and patient cooperative  Airway & Oxygen Therapy: Patient Spontanous Breathing and Patient connected to face mask oxygen  Post-op Assessment: Report given to PACU RN, Post -op Vital signs reviewed and stable and Patient moving all extremities  Post vital signs: Reviewed and stable  Complications: No apparent anesthesia complications

## 2011-11-21 NOTE — Anesthesia Preprocedure Evaluation (Signed)
Anesthesia Evaluation  Patient identified by MRN, date of birth, ID band Patient awake    Reviewed: Allergy & Precautions, H&P , NPO status , Patient's Chart, lab work & pertinent test results  Airway Mallampati: II TM Distance: >3 FB Neck ROM: Full    Dental No notable dental hx.    Pulmonary neg pulmonary ROS,  breath sounds clear to auscultation  Pulmonary exam normal       Cardiovascular hypertension, Pt. on medications Rhythm:Regular Rate:Normal     Neuro/Psych negative neurological ROS  negative psych ROS   GI/Hepatic negative GI ROS, Neg liver ROS,   Endo/Other  negative endocrine ROS  Renal/GU negative Renal ROS  negative genitourinary   Musculoskeletal negative musculoskeletal ROS (+)   Abdominal   Peds negative pediatric ROS (+)  Hematology negative hematology ROS (+)   Anesthesia Other Findings   Reproductive/Obstetrics negative OB ROS                           Anesthesia Physical Anesthesia Plan  ASA: II  Anesthesia Plan: General   Post-op Pain Management:    Induction: Intravenous  Airway Management Planned: Oral ETT  Additional Equipment:   Intra-op Plan:   Post-operative Plan: Extubation in OR  Informed Consent: I have reviewed the patients History and Physical, chart, labs and discussed the procedure including the risks, benefits and alternatives for the proposed anesthesia with the patient or authorized representative who has indicated his/her understanding and acceptance.   Dental advisory given  Plan Discussed with: CRNA and Surgeon  Anesthesia Plan Comments:         Anesthesia Quick Evaluation  

## 2011-11-21 NOTE — H&P (Signed)
HPI   Austin Aguilar [facility]   12/20/1955 [id number]   Patient Care Team: Tresa Garter, MD as PCP - General (Internal Medicine)   This patient is a 56 y.o.male who presents today for surgical evaluation at the request of Dr. Posey Rea.    Reason for visit: Umbilical swelling.  Probable hernia.   Patient is a pleasant active male.  He's noticed a lump in his bellybutton for the past two years.  It became more bothersome this year.  He often runs and is rather physically active.  He's noticed more pain and soreness with bending and running and twisting.  It is affecting his ability to exercise.  Based on concerns, to his primary care physician, Dr. Posey Rea, felt that surgery may be warranted and sent him to [place] for evaluation.   Patient recalls having a hydrocele removed in his left groin when he was in his 73s.  He is occasionally noted some discomfort in his right groin also on later discussion.  Has daily bowel movements.  Had a normal colonoscopy.  He's on his feet all day and can walk several miles normally.  It hurts to run at his bellybutton but no history of heart or lung problems    Patient Active Problem List   Diagnosis   .  ANXIETY   .  INSOMNIA, CHRONIC   .  HYPERTENSION   .  DIVERTICULOSIS, COLON   .  PROSTATITIS, ACUTE   .  PLANTAR FASCIITIS   .  CHEST DISCOMFORT   .  PELVIC PAIN, ACUTE   .  TOBACCO USE, QUIT   .  Well adult exam   .  Shoulder pain, left   .  Hyperglycemia   .  Umbilical hernia   .  Inguinal hernia, right   .  Groin mass on left (old hydrocele vs recurrent hernia   .  Diastasis recti         Past Medical History   Diagnosis  Date   .  Diverticulosis of colon     .  Anxiety     .  Depression         Dr Debbe Bales - psychiatrist   .  HTN (hypertension)  2011         Past Surgical History   Procedure  Date   .  Shoulder arthroscopy  01/01/11   .  Hernia repair  1978         History       Social History   .  Marital Status:   Married       Spouse Name:  N/A       Number of Children:  N/A   .  Years of Education:  N/A       Occupational History   .  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  Smoking status:  Former Games developer   .  Smokeless tobacco:  Not on file     Comment: quit 1990's/2nd hand exposure from wife (min)   .  Alcohol Use:  1.8 oz/week       3 Cans of beer per week   .  Drug Use:  No   .  Sexually Active:  Yes       Other Topics  Concern   .  Not on file       Social History Narrative  Regular exercise - YES, running         Family History   Problem  Relation  Age of Onset   .  Hypertension  Mother     .  Hyperlipidemia  Mother     .  Stroke  Mother     .  Coronary artery disease  Father         ?CABG    .  COPD  Brother     .  Hypertension  Brother     .  Heart disease  Brother     .  Coronary artery disease  Brother     .  Alcohol abuse  Brother     .  Drug abuse  Brother     .  Alcohol abuse  Sister     .  Drug abuse  Sister           Current Outpatient Prescriptions   Medication  Sig  Dispense  Refill   .  amLODipine (NORVASC) 5 MG tablet  TAKE 1 TABLET BY MOUTH EVERY DAY   90 tablet   2   .  Ascorbic Acid (VITAMIN C) 1000 MG tablet  Take 1,000 mg by mouth daily.           Marland Kitchen  aspirin 81 MG EC tablet  Take 81 mg by mouth daily.           .  fish oil-omega-3 fatty acids 1000 MG capsule  Take 1 g by mouth daily.           Marland Kitchen  losartan (COZAAR) 100 MG tablet  1 BY MOUTH ONCE DAILY FOR BLOOD PRESSURE   30 tablet   11   .  Multiple Vitamin (MULTIVITAMIN) tablet  Take 1 tablet by mouth daily.           .  traZODone (DESYREL) 50 MG tablet  Take 1 tablet (50 mg total) by mouth at bedtime.   90 tablet   1        No Known Allergies   BP 132/80  Pulse 68  Temp 98.8 F (37.1 C) (Temporal)  Resp 16  Ht 5\' 9"  (1.753 m)  Wt 184 lb 12.8 oz (83.825 kg)  BMI 27.29 kg/m2   No results found.     Review of Systems    Constitutional: Negative for fever, chills and diaphoresis.  HENT: Negative for nosebleeds, sore throat, facial swelling, mouth sores, trouble swallowing and ear discharge.   Eyes: Negative for photophobia, discharge and visual disturbance.  Respiratory: Negative for choking, chest tightness, shortness of breath and stridor.   Cardiovascular: Negative for chest pain and palpitations.        Patient walks 60 minutes for about 2 miles without difficulty.  No exertional chest/neck/shoulder/arm pain.   Gastrointestinal: Positive for abdominal pain. Negative for nausea, vomiting, diarrhea, constipation, blood in stool, abdominal distention, anal bleeding and rectal pain.  Genitourinary: Negative for dysuria, urgency, difficulty urinating and testicular pain.  Musculoskeletal: Negative for myalgias, back pain, arthralgias and gait problem.  Skin: Negative for color change, pallor, rash and wound.  Neurological: Negative for dizziness, speech difficulty, weakness, numbness and headaches.  Hematological: Negative for adenopathy. Does not bruise/bleed easily.  Psychiatric/Behavioral: Negative for hallucinations, confusion and agitation.          Objective:     Physical Exam  Constitutional: He is oriented to person, place, and time. He appears well-developed and well-nourished. No distress.  HENT:  Head: Normocephalic.   Mouth/Throat: Oropharynx is clear and moist. No oropharyngeal exudate.  Eyes: Conjunctivae and EOM are normal. Pupils are equal, round, and reactive to light. No scleral icterus.  Neck: Normal range of motion. Neck supple. No tracheal deviation present.  Cardiovascular: Normal rate, regular rhythm and intact distal pulses.   Pulmonary/Chest: Effort normal and breath sounds normal. No respiratory distress.  Abdominal: Soft. He exhibits no distension. There is no tenderness. A hernia is present. Hernia confirmed positive in the ventral area and confirmed positive in the right  inguinal area.    Musculoskeletal: Normal range of motion. He exhibits no tenderness.  Lymphadenopathy:    He has no cervical adenopathy.       Right: No inguinal adenopathy present.       Left: No inguinal adenopathy present.  Neurological: He is alert and oriented to person, place, and time. No cranial nerve deficit. He exhibits normal muscle tone. Coordination normal.  Skin: Skin is warm and dry. No rash noted. He is not diaphoretic. No erythema. No pallor.  Psychiatric: He has a normal mood and affect. His behavior is normal. Judgment and thought content normal.          Assessment:       Umbilical hernia in the setting of diastases recti and a moderately active male.   Bilateral groin masses.  Definite right inguinal hernia.  Probable old hydrocele on the left groin vs possible recurrent left inguinal hernia as well     Plan:        I feel that the umbilical hernia needs to be repaired.  Given its moderate size and in the setting of diastases, I think he would benefit from laparoscopic underlay repair.  Laparoscopy would also help me have a chance to look in the groins and see if he has bilateral recurrences are just unilateral.  I also recommended repair of at least the right groin and possibly left if there is hernia repair as well.  Another option is to consider an ultrasound of the groins but I don't think it would change my operative approach at this moment.   He wants to coordinate his to minimize his chance off work off.  I cautioned him given his intense activity level that he will be out at least a couple weeks, possibly longer.  He was concerned about that but expressed understanding.  I did discuss the procedure with him:   The anatomy & physiology of the abdominal wall & inguinal region were discussed.  The pathophysiology of hernias was discussed.  Natural history risks without surgery including progeressive enlargement, pain, incarceration & strangulation was  discussed.   Contributors to complications such as smoking, obesity, diabetes, prior surgery, etc were discussed.   I feel the risks of no intervention will lead to serious problems that outweigh the operative risks; therefore, I recommended surgery to reduce and repair the hernias.  I explained laparoscopic techniques with possible need for an open approach.  I noted the probable use of mesh to patch and/or buttress the hernia repair   Risks such as bleeding, infection, abscess, need for further treatment, heart attack, death, and other risks were discussed.  I noted a good likelihood this will help address the problem.   Goals of post-operative recovery were discussed as well.  Possibility that this will not correct all symptoms was explained.  I stressed the importance of low-impact activity, aggressive pain control, avoiding constipation, & not pushing through pain to minimize risk  of post-operative chronic pain or injury. Possibility of reherniation especially with smoking, obesity, diabetes, immunosuppression, and other health conditions was discussed.  We will work to minimize complications.      An educational handout further explaining the pathology & treatment options was given as well.  Questions were answered.  The patient expresses understanding & wishes to proceed with surgery.  I have re-reviewed the the patient's records, history, medications, and allergies.  I have re-examined the patient.  I again discussed intraoperative plans and goals of post-operative recovery.  The patient agrees to proceed.

## 2011-11-21 NOTE — Progress Notes (Signed)
Pt sitting in chair in room and states he is feeling nauseated and dizzy.  Pt asked to get back into bed and ly down.  Cool cloth placed on pt's forehead.  Room light dimmed.  Pt encouraged to do slow deep breathing and ly down for a little while.  Call bell within reach and wife at bedside. VS stable, see doc flowsheet.

## 2011-11-21 NOTE — Anesthesia Postprocedure Evaluation (Signed)
  Anesthesia Post-op Note  Patient: Austin Aguilar  Procedure(s) Performed: Procedure(s) (LRB): LAPAROSCOPIC VENTRAL HERNIA (N/A) LAPAROSCOPIC INGUINAL HERNIA (Bilateral)  Patient Location: PACU  Anesthesia Type: General  Level of Consciousness: awake and alert   Airway and Oxygen Therapy: Patient Spontanous Breathing  Post-op Pain: mild  Post-op Assessment: Post-op Vital signs reviewed, Patient's Cardiovascular Status Stable, Respiratory Function Stable, Patent Airway and No signs of Nausea or vomiting  Post-op Vital Signs: stable  Complications: No apparent anesthesia complications

## 2011-11-22 ENCOUNTER — Telehealth (INDEPENDENT_AMBULATORY_CARE_PROVIDER_SITE_OTHER): Payer: Self-pay

## 2011-11-22 ENCOUNTER — Encounter (HOSPITAL_COMMUNITY): Payer: Self-pay | Admitting: Surgery

## 2011-11-22 NOTE — Telephone Encounter (Signed)
Pts wife given po appt date.

## 2011-12-11 ENCOUNTER — Encounter (INDEPENDENT_AMBULATORY_CARE_PROVIDER_SITE_OTHER): Payer: Self-pay

## 2011-12-11 ENCOUNTER — Ambulatory Visit (INDEPENDENT_AMBULATORY_CARE_PROVIDER_SITE_OTHER): Payer: BC Managed Care – PPO | Admitting: Surgery

## 2011-12-11 ENCOUNTER — Encounter (INDEPENDENT_AMBULATORY_CARE_PROVIDER_SITE_OTHER): Payer: Self-pay | Admitting: Surgery

## 2011-12-11 VITALS — BP 134/80 | HR 70 | Temp 97.6°F | Resp 14 | Ht 69.0 in | Wt 187.4 lb

## 2011-12-11 DIAGNOSIS — K4020 Bilateral inguinal hernia, without obstruction or gangrene, not specified as recurrent: Secondary | ICD-10-CM

## 2011-12-11 DIAGNOSIS — M6208 Separation of muscle (nontraumatic), other site: Secondary | ICD-10-CM

## 2011-12-11 DIAGNOSIS — K439 Ventral hernia without obstruction or gangrene: Secondary | ICD-10-CM

## 2011-12-11 DIAGNOSIS — IMO0002 Reserved for concepts with insufficient information to code with codable children: Secondary | ICD-10-CM

## 2011-12-11 DIAGNOSIS — M6200 Separation of muscle (nontraumatic), unspecified site: Secondary | ICD-10-CM

## 2011-12-11 NOTE — Progress Notes (Signed)
Subjective:     Patient ID: Austin Aguilar, male   DOB: 1955/06/15, 56 y.o.   MRN: [id number]  HPI  Austin Aguilar [facility]  January 09, 1956 [id number]  Patient Care Team: Tresa Garter, MD as PCP - General (Internal Medicine)  This patient is a 56 y.o.male who presents today for surgical evaluation  Patient returns now almost 3 weeks status post laparoscopic repair of a small recurrent left inguinal hernia, large right inguinal hernia, moderate plantar wall in abdominal hernia.  Had moderate swelling and bruising.  Bruising gone down.  Stopped narcotics one week after surgery.  Trying to use intermittent ibuprofen and heat.  Still sore in right groin.  Mild swelling in abdomen but better.  Urinating fine.  Had constipation but now moving bowels.  Energy level improving.  He wants to get back to work but is concerned about being ready given that he has unrestricted lifting at his job.  Patient Active Problem List  Diagnosis  . ANXIETY  . INSOMNIA, CHRONIC  . HYPERTENSION  . DIVERTICULOSIS, COLON  . PROSTATITIS, ACUTE  . PLANTAR FASCIITIS  . CHEST DISCOMFORT  . PELVIC PAIN, ACUTE  . TOBACCO USE, QUIT  . Well adult exam  . Shoulder pain, left  . Hyperglycemia  . Ventral hernia  . Inguinal hernia, bilateral  . Groin mass on left (old hydrocele vs recurrent hernia  . Diastasis recti    Past Medical History  Diagnosis Date  . Diverticulosis of colon   . Anxiety   . HTN (hypertension) 2011    12/10  stress  test Dr Eden Emms EPIC  . Depression     denies at present- states resolved- changed jobs    Past Surgical History  Procedure Date  . Shoulder arthroscopy 01/01/11  . Hernia repair 1978  . Ventral hernia repair 11/21/2011    Procedure: LAPAROSCOPIC VENTRAL HERNIA;  Surgeon: Ardeth Sportsman, MD;  Location: [facility];  Service: General;  Laterality: N/A;  laparoscopic ventral wall hernia repair  . Inguinal hernia repair 11/21/2011    Procedure: LAPAROSCOPIC INGUINAL HERNIA;   Surgeon: Ardeth Sportsman, MD;  Location: [facility];  Service: General;  Laterality: Bilateral;  BILATERAL INGUINAL HERNIA REPAIRS LAPARASCOPIC    History   Social History  . Marital Status: Married    Spouse Name: N/A    Number of Children: N/A  . Years of Education: N/A   Occupational History  . 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 Smoking status: Former Smoker    Types: Cigarettes, Pipe, Cigars    Quit date: 11/18/1988  . Smokeless tobacco: Not on file   Comment: quit 1990's/2nd hand exposure from wife (min)  . Alcohol Use: 1.8 oz/week    3 Cans of beer per week     3/day on occasion  . Drug Use: No  . Sexually Active: Yes   Other Topics Concern  . Not on file   Social History Narrative   Regular exercise - YES, running    Family History  Problem Relation Age of Onset  . Hypertension Mother   . Hyperlipidemia Mother   . Stroke Mother   . Coronary artery disease Father     ?CABG   . COPD Brother   . Hypertension Brother   . Heart disease Brother   . Coronary artery disease Brother   . Alcohol abuse Brother   . Drug abuse Brother   . Alcohol abuse  Sister   . Drug abuse Sister     Current Outpatient Prescriptions  Medication Sig Dispense Refill  . amLODipine (NORVASC) 5 MG tablet Take 5 mg by mouth daily before breakfast.      . Ascorbic Acid (VITAMIN C) 1000 MG tablet Take 1,000 mg by mouth daily.       Marland Kitchen aspirin 81 MG EC tablet Take 81 mg by mouth daily.       Marland Kitchen losartan (COZAAR) 100 MG tablet Take 100 mg by mouth daily before breakfast.      . Omega-3 Fatty Acids (FISH OIL) 1200 MG CAPS Take 1 capsule by mouth daily.      . traZODone (DESYREL) 50 MG tablet Take 50 mg by mouth at bedtime.         No Known Allergies  BP 134/80  Pulse 70  Temp 97.6 F (36.4 C) (Temporal)  Resp 14  Ht 5\' 9"  (1.753 m)  Wt 187 lb 6 oz (84.993 kg)  BMI 27.67 kg/m2  Dg Chest 2 View  11/19/2011  *RADIOLOGY REPORT*  Clinical Data:  56 year old male preoperative study for hernia repair.  Hypertension and diabetes.  CHEST - 2 VIEW  Comparison: None.  Findings: Cardiac size at the upper limits of normal. Other mediastinal contours are within normal limits.  Normal lung volumes. Visualized tracheal air column is within normal limits. No pneumothorax, pulmonary edema, pleural effusion or confluent pulmonary opacity. No acute osseous abnormality identified.  IMPRESSION: No acute cardiopulmonary abnormality.   Original Report Authenticated By: Harley Hallmark, M.D.      Review of Systems  Constitutional: Negative for fever, chills and diaphoresis.  HENT: Negative for sore throat, trouble swallowing and neck pain.   Eyes: Negative for photophobia and visual disturbance.  Respiratory: Negative for choking and shortness of breath.   Cardiovascular: Negative for chest pain and palpitations.  Gastrointestinal: Positive for abdominal pain. Negative for nausea, vomiting, abdominal distention, anal bleeding and rectal pain.  Genitourinary: Negative for dysuria, urgency, difficulty urinating and testicular pain.  Musculoskeletal: Negative for myalgias, arthralgias and gait problem.  Skin: Negative for color change and rash.  Neurological: Negative for dizziness, speech difficulty, weakness and numbness.  Hematological: Negative for adenopathy.  Psychiatric/Behavioral: Negative for hallucinations, confusion and agitation.       Objective:   Physical Exam  Constitutional: He is oriented to person, place, and time. He appears well-developed and well-nourished. No distress.  HENT:  Head: Normocephalic.  Mouth/Throat: Oropharynx is clear and moist. No oropharyngeal exudate.  Eyes: Conjunctivae normal and EOM are normal. Pupils are equal, round, and reactive to light. No scleral icterus.  Neck: Normal range of motion. No tracheal deviation present.  Cardiovascular: Normal rate, normal heart sounds and intact distal pulses.     Pulmonary/Chest: Effort normal. No respiratory distress.  Abdominal: Soft. He exhibits no distension. There is no tenderness. Hernia confirmed negative in the right inguinal area and confirmed negative in the left inguinal area.         Incisions clean with normal healing ridges.  No hernias  Musculoskeletal: Normal range of motion. He exhibits no tenderness.  Neurological: He is alert and oriented to person, place, and time. No cranial nerve deficit. He exhibits normal muscle tone. Coordination normal.  Skin: Skin is warm and dry. No rash noted. He is not diaphoretic.  Psychiatric: He has a normal mood and affect. His behavior is normal.       Assessment:     A few weeks  out from laparoscopic repair of left recurrent inguinal hernia, right large inguinal hernia, & moderate abdominal wall ventral hernia.  Status post repairs with mesh.  Gradually recovering.Moderate right groin hematoma/trauma.  No evidence of infection nor cellulitis nor hernia recurrence    Plan:     Increase activity as tolerated.  Do not push through pain.  Anti-inflammatory regimen with ibuprofen and heat around the clock should help the groin hematoma and overall soreness improve more quickly.  I would hold off on unrestricted activity for a few more weeks.  Note written.  Advanced on diet as tolerated. Bowel regimen to avoid problems.  I offered to see him in a few weeks to make sure he continues to improve.  He felt comfortable w calling me if he did not get better.  Return to clinic p.r.n. The patient expressed understanding and appreciation

## 2011-12-11 NOTE — Patient Instructions (Signed)
Managing Pain  Pain after surgery or related to activity is often due to strain/injury to muscle, tendon, nerves and/or incisions.  This pain is usually short-term and will improve in a few months.   Many people find it helpful to do the following things TOGETHER to help speed the process of healing and to get back to regular activity more quickly:  1. Avoid heavy physical activity a.  no lifting greater than 20 pounds b. Do not "push through" the pain.  Listen to your body and avoid positions and maneuvers than reproduce the pain c. Walking is okay as tolerated, but go slowly and stop when getting sore.  d. Remember: If it hurts to do it, then don't do it! 2. Take Anti-inflammatory medication  a. Take with food/snack around the clock for 1-2 weeks i. This helps the muscle and nerve tissues become less irritable and calm down faster ii. Ibuprofen 200mg  tabs (ex. Advil, Motrin) 4 pills with every meal and just before bedtime 3. Use a Heating pad or Ice/Cold Pack a. 4-6 times a day b. May use warm bath/hottub  or showers 4. Try Gentle Massage and/or Stretching  a. at the area of pain many times a day b. stop if you feel pain - do not overdo it  Try these steps together to help you body heal faster and avoid making things get worse.  Doing just one of these things may not be enough.    If you are not getting better after two weeks or are noticing you are getting worse, contact our office for further advice; we may need to re-evaluate you & see what other things we can do to help.

## 2011-12-24 ENCOUNTER — Other Ambulatory Visit: Payer: Self-pay | Admitting: Internal Medicine

## 2011-12-26 NOTE — Telephone Encounter (Signed)
LOSARTAN refill requested this is a historical med, pt last seen on 09/16/11.

## 2012-01-21 ENCOUNTER — Ambulatory Visit (INDEPENDENT_AMBULATORY_CARE_PROVIDER_SITE_OTHER): Payer: BC Managed Care – PPO | Admitting: General Surgery

## 2012-01-21 ENCOUNTER — Other Ambulatory Visit: Payer: Self-pay | Admitting: Internal Medicine

## 2012-01-21 ENCOUNTER — Encounter (INDEPENDENT_AMBULATORY_CARE_PROVIDER_SITE_OTHER): Payer: Self-pay | Admitting: General Surgery

## 2012-01-21 VITALS — BP 150/64 | HR 92 | Temp 98.0°F | Resp 20 | Ht 69.0 in | Wt 186.6 lb

## 2012-01-21 DIAGNOSIS — N433 Hydrocele, unspecified: Secondary | ICD-10-CM

## 2012-01-21 NOTE — Progress Notes (Signed)
Patient ID: Austin Aguilar, male   DOB: [birth year], 56 y.o.   MRN: [id number] This is a 56 year old male status post umbilical and bilateral inguinal hernia repair by Dr. Michaell Cowing. The patient has had issues with a right hydrocele postoperatively. The patient has had pain more so did the day after beyond the working. He states he had little relief with tightfitting garments and loose fitting garments to help with pain and the edema.  On exam:  The patient is a large hydrocele his right scrotum. There is no recurrent hernia is palpated there is no infection or erythema.  Assessment and plan:  #1 I recommended proceeding with athletic supporter to help his right hydrocele  Reabsorption. We discussed the pathology that led to this at this point all most likely should be conservative treatment which were continued at this time versus possible surgical resection his hydrocele evacuation of fluid.  2. I have the patient come back to Dr. Michaell Cowing in 3 or 4 weeks after presenting with athletic supporter treatment discussed possible resolution versus possible operative intervention.

## 2012-02-19 ENCOUNTER — Encounter (INDEPENDENT_AMBULATORY_CARE_PROVIDER_SITE_OTHER): Payer: BC Managed Care – PPO | Admitting: Surgery

## 2012-02-19 ENCOUNTER — Ambulatory Visit (INDEPENDENT_AMBULATORY_CARE_PROVIDER_SITE_OTHER): Payer: BC Managed Care – PPO | Admitting: Surgery

## 2012-02-19 ENCOUNTER — Encounter (INDEPENDENT_AMBULATORY_CARE_PROVIDER_SITE_OTHER): Payer: Self-pay | Admitting: Surgery

## 2012-02-19 VITALS — BP 124/78 | HR 80 | Temp 98.0°F | Resp 16 | Ht 69.0 in | Wt 191.8 lb

## 2012-02-19 DIAGNOSIS — N508 Other specified disorders of male genital organs: Secondary | ICD-10-CM

## 2012-02-19 DIAGNOSIS — N5089 Other specified disorders of the male genital organs: Secondary | ICD-10-CM | POA: Insufficient documentation

## 2012-02-19 NOTE — Progress Notes (Addendum)
Subjective:     Patient ID: Austin Aguilar, male   DOB: 1955-07-29, 56 y.o.   MRN: [id number]  HPI   [name] [facility]  22-Aug-1955 [id number]  Patient Care Team: Tresa Garter, MD as PCP - General (Internal Medicine)  This patient is a 56 y.o.male who presents today for surgical evaluation  Patient returns now almost 3 weeks status post laparoscopic repair of a small recurrent left inguinal hernia, large right inguinal hernia, moderate plantar wall in abdominal hernia.  Had moderate swelling and bruising.  Bruising gone down.  Stopped narcotics one week after surgery.  Trying to use intermittent ibuprofen and heat.  Still sore in right groin.  Mild swelling in abdomen but better.  Urinating fine.  Had constipation but now moving bowels.  Energy level improving.  He wants to get back to work but is concerned about being ready given that he has unrestricted lifting at his job.  Patient Active Problem List  Diagnosis  . ANXIETY  . INSOMNIA, CHRONIC  . HYPERTENSION  . DIVERTICULOSIS, COLON  . PROSTATITIS, ACUTE  . PLANTAR FASCIITIS  . CHEST DISCOMFORT  . PELVIC PAIN, ACUTE  . TOBACCO USE, QUIT  . Well adult exam  . Shoulder pain, left  . Hyperglycemia  . Diastasis recti  . Scrotal mass on right, probable hydrocele    Past Medical History  Diagnosis Date  . Diverticulosis of colon   . Anxiety   . HTN (hypertension) 2011    12/10  stress  test Dr Eden Emms EPIC  . Depression     denies at present- states resolved- changed jobs    Past Surgical History  Procedure Date  . Shoulder arthroscopy 01/01/11  . Ventral hernia repair 11/21/2011    Procedure: LAPAROSCOPIC VENTRAL HERNIA;  Surgeon: Ardeth Sportsman, MD;  Location: [facility];  Service: General;  Laterality: N/A;  laparoscopic ventral wall hernia repair  . Inguinal hernia repair 11/21/2011    Procedure: LAPAROSCOPIC INGUINAL HERNIA;  Surgeon: Ardeth Sportsman, MD;  Location: [facility];  Service: General;  Laterality:  Bilateral;  BILATERAL INGUINAL HERNIA REPAIRS LAPARASCOPIC  . Hernia repair 1978    [facility]    History   Social History  . Marital Status: Married    Spouse Name: N/A    Number of Children: N/A  . Years of Education: N/A   Occupational History  . 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 Smoking status: Former Smoker    Types: Cigarettes, Pipe, Cigars    Quit date: 11/18/1988  . Smokeless tobacco: Not on file     Comment: quit 1990's/2nd hand exposure from wife (min)  . Alcohol Use: 1.8 oz/week    3 Cans of beer per week     Comment: 3/day on occasion  . Drug Use: No  . Sexually Active: Yes   Other Topics Concern  . Not on file   Social History Narrative   Regular exercise - YES, running    Family History  Problem Relation Age of Onset  . Hypertension Mother   . Hyperlipidemia Mother   . Stroke Mother   . Coronary artery disease Father     ?CABG   . COPD Brother   . Hypertension Brother   . Heart disease Brother   . Coronary artery disease Brother   . Alcohol abuse Brother   . Drug abuse Brother   . Alcohol abuse Sister   .  Drug abuse Sister     Current Outpatient Prescriptions  Medication Sig Dispense Refill  . amLODipine (NORVASC) 5 MG tablet TAKE 1 TABLET BY MOUTH EVERY DAY  90 tablet  2  . Ascorbic Acid (VITAMIN C) 1000 MG tablet Take 1,000 mg by mouth daily.       Marland Kitchen aspirin 81 MG EC tablet Take 81 mg by mouth daily.       Marland Kitchen losartan (COZAAR) 100 MG tablet Take 100 mg by mouth daily before breakfast.      . Omega-3 Fatty Acids (FISH OIL) 1200 MG CAPS Take 1 capsule by mouth daily.      . traZODone (DESYREL) 50 MG tablet Take 50 mg by mouth at bedtime.         No Known Allergies  BP 124/78  Pulse 80  Temp 98 F (36.7 C) (Temporal)  Resp 16  Ht 5\' 9"  (1.753 m)  Wt 191 lb 12.8 oz (87 kg)  BMI 28.32 kg/m2  Dg Chest 2 View  11/19/2011  *RADIOLOGY REPORT*  Clinical Data: 56 year old male preoperative study for  hernia repair.  Hypertension and diabetes.  CHEST - 2 VIEW  Comparison: None.  Findings: Cardiac size at the upper limits of normal. Other mediastinal contours are within normal limits.  Normal lung volumes. Visualized tracheal air column is within normal limits. No pneumothorax, pulmonary edema, pleural effusion or confluent pulmonary opacity. No acute osseous abnormality identified.  IMPRESSION: No acute cardiopulmonary abnormality.   Original Report Authenticated By: Harley Hallmark, M.D.      Review of Systems  Constitutional: Negative for fever, chills and diaphoresis.  HENT: Negative for sore throat, trouble swallowing and neck pain.   Eyes: Negative for photophobia and visual disturbance.  Respiratory: Negative for choking and shortness of breath.   Cardiovascular: Negative for chest pain and palpitations.  Gastrointestinal: Positive for abdominal pain. Negative for nausea, vomiting, abdominal distention, anal bleeding and rectal pain.  Genitourinary: Negative for dysuria, urgency, difficulty urinating and testicular pain.  Musculoskeletal: Negative for myalgias, arthralgias and gait problem.  Skin: Negative for color change and rash.  Neurological: Negative for dizziness, speech difficulty, weakness and numbness.  Hematological: Negative for adenopathy.  Psychiatric/Behavioral: Negative for hallucinations, confusion and agitation.       Objective:   Physical Exam  Constitutional: He is oriented to person, place, and time. He appears well-developed and well-nourished. No distress.  HENT:  Head: Normocephalic.  Mouth/Throat: Oropharynx is clear and moist. No oropharyngeal exudate.  Eyes: Conjunctivae normal and EOM are normal. Pupils are equal, round, and reactive to light. No scleral icterus.  Neck: Normal range of motion. No tracheal deviation present.  Cardiovascular: Normal rate, normal heart sounds and intact distal pulses.   Pulmonary/Chest: Effort normal. No respiratory  distress.  Abdominal: Soft. He exhibits no distension. There is no tenderness. There is no CVA tenderness. No hernia. Hernia confirmed negative in the ventral area, confirmed negative in the right inguinal area and confirmed negative in the left inguinal area.         Incisions clean with normal healing ridges.  No hernias  Genitourinary:     Musculoskeletal: Normal range of motion. He exhibits no tenderness.  Neurological: He is alert and oriented to person, place, and time. No cranial nerve deficit. He exhibits normal muscle tone. Coordination normal.  Skin: Skin is warm and dry. No rash noted. He is not diaphoretic.  Psychiatric: He has a normal mood and affect. His behavior is normal.  Assessment:     3 months out laparoscopic repair of left recurrent inguinal hernia, right large inguinal hernia, & moderate abdominal wall ventral hernia with mesh.  No more hematoma/seroma at groin.  Persistent/worse swelling of right scrotum >3 months.  Strongly suspicious for hydrocele.  Not resolved after three months.    Plan:     He does have a distinctive fluid collection in his right scrotum.  Suspicious for hydrocele.  I discussed with Dr. Sherron Monday with [facility].   He is hopeful that the fluid collection could still possibly resolve by six months.  However, he agrees the patient could benefit from ultrasound and consultation with urology.  We are skeptical that needle aspiration will have any good long-term result.  May need hydrocelectomy.  The patient is having a lot of pulling and misery with it.  He cannot tolerate it by the end of the day.  It is of moderate size.  Appointment set up to see Dr. Annabell Howells with [facility].  Increase activity as tolerated.  Do not push through pain.  Anti-inflammatory regimen with ibuprofen and heat around the clock should help deal with symptoms.  I doubt it is going to resolve further.  Det as tolerated. Bowel regimen to avoid  problems.  Return to clinic p.r.n. The patient expressed understanding and appreciation

## 2012-02-19 NOTE — Addendum Note (Signed)
Addended byLittie Deeds on: 02/19/2012 12:10 PM   Modules accepted: Orders

## 2012-02-19 NOTE — Patient Instructions (Addendum)
I suspect That the persistent swelling in the right scrotum is from a hydrocele.  Hydrocele, Adult Fluid can collect around the testicles. This fluid forms in a sac. This condition is called a hydrocele. The collected fluid causes swelling of the scrotum. Usually, it affects just one testicle. Most of the time, the condition does not cause pain. Sometimes, the hydrocele goes away on its own. Other times, surgery is needed to get rid of the fluid. CAUSES A hydrocele does not develop often. Different things can cause a hydrocele in a man, including:  Injury to the scrotum.  Infection.  X-ray of the area around the scrotum.  A tumor or cancer of the testicle.  Twisting of a testicle.  Decreased blood flow to the scrotum. SYMPTOMS   Swelling without pain. The hydrocele feels like a water-filled balloon.  Swelling with pain. This can occur if the hydrocele was caused by infection or twisting.  Mild discomfort in the scrotum.  The hydrocele may feel heavy.  Swelling that gets smaller when you lie down. DIAGNOSIS  Your caregiver will do a physical exam to decide if you have a hydrocele. This may include:  Asking questions about your overall health, today and in the past. Your caregiver may ask about any injuries, X-rays, or infections.  Pushing on your abdomen or asking you to change positions to see if the size of the hydrocele changes.  Shining a light through the scrotum (transillumination) to see if the fluid inside the scrotum is clear.  Blood tests and urine tests to check for infection.  Imaging studies that take pictures of the scrotum and testicles. TREATMENT  Treatment depends in part on what caused the condition. Options include:  Watchful waiting. Your caregiver checks the hydrocele every so often.  Different surgeries to drain the fluid.  A needle may be put into the scrotum to drain fluid (needle aspiration). Fluid often returns after this type of  treatment.  A cut (incision) may be made in the scrotum to remove the fluid sac (hydrocelectomy).  An incision may be made in the groin to repair a hydrocele that has contact with abdominal fluids (communicating hydrocele).  Medicines to treat an infection (antibiotics). HOME CARE INSTRUCTIONS  What you need to do at home may depend on the cause of the hydrocele and type of treatment. In general:  Take all medicine as directed by your caregiver. Follow the directions carefully.  Ask your caregiver if there is anything you should not do while you recover (activities, lifting, work, sex).  If you had surgery to repair a communicating hydrocele, recovery time may vary. Ask you caregiver about your recovery time.  Avoid heavy lifting for 4 to 6 weeks.  If you had an incision on the scrotum or groin, wash it for 2 to 3 days after surgery. Do this as long as the skin is closed and there are no gaps in the wound. Wash gently, and avoid rubbing the incision.  Keep all follow-up appointments. SEEK MEDICAL CARE IF:   Your scrotum seems to be getting larger.  The area becomes more and more uncomfortable. SEEK IMMEDIATE MEDICAL CARE IF:  You have a fever. Document Released: 08/22/2009 Document Revised: 05/27/2011 Document Reviewed: 08/22/2009 [facility] Patient Information 2013 [place], [place].

## 2012-02-25 ENCOUNTER — Encounter (INDEPENDENT_AMBULATORY_CARE_PROVIDER_SITE_OTHER): Payer: BC Managed Care – PPO | Admitting: Surgery

## 2012-03-17 ENCOUNTER — Other Ambulatory Visit: Payer: Self-pay | Admitting: Urology

## 2012-03-19 ENCOUNTER — Encounter: Payer: Self-pay | Admitting: Internal Medicine

## 2012-03-19 ENCOUNTER — Ambulatory Visit (INDEPENDENT_AMBULATORY_CARE_PROVIDER_SITE_OTHER): Payer: BC Managed Care – PPO | Admitting: Internal Medicine

## 2012-03-19 VITALS — BP 160/100 | HR 80 | Temp 98.0°F | Resp 16 | Wt 190.0 lb

## 2012-03-19 DIAGNOSIS — Z23 Encounter for immunization: Secondary | ICD-10-CM

## 2012-03-19 DIAGNOSIS — I10 Essential (primary) hypertension: Secondary | ICD-10-CM

## 2012-03-19 DIAGNOSIS — R739 Hyperglycemia, unspecified: Secondary | ICD-10-CM

## 2012-03-19 DIAGNOSIS — N529 Male erectile dysfunction, unspecified: Secondary | ICD-10-CM

## 2012-03-19 DIAGNOSIS — N508 Other specified disorders of male genital organs: Secondary | ICD-10-CM

## 2012-03-19 DIAGNOSIS — F411 Generalized anxiety disorder: Secondary | ICD-10-CM

## 2012-03-19 DIAGNOSIS — N5089 Other specified disorders of the male genital organs: Secondary | ICD-10-CM

## 2012-03-19 DIAGNOSIS — R7309 Other abnormal glucose: Secondary | ICD-10-CM

## 2012-03-19 MED ORDER — SILDENAFIL CITRATE 100 MG PO TABS: 100.0000 mg | ORAL_TABLET | ORAL | Status: DC | PRN

## 2012-03-19 NOTE — Progress Notes (Signed)
Patient ID: Austin Aguilar, male   DOB: [birth year], 56 y.o.   MRN: [id number]   Subjective:    HPI  C/o ED - new  The patient is here to follow up on chronic depression, anxiety, HTN controlled with medicines    Review of Systems  Constitutional: Negative for appetite change, fatigue and unexpected weight change.  HENT: Negative for nosebleeds, congestion, sore throat, sneezing, trouble swallowing and neck pain.   Eyes: Negative for itching and visual disturbance.  Respiratory: Negative for cough.   Cardiovascular: Negative for chest pain, palpitations and leg swelling.  Gastrointestinal: Negative for nausea, diarrhea, blood in stool and abdominal distention.  Genitourinary: Negative for frequency and hematuria.  Musculoskeletal: Negative for back pain, joint swelling and gait problem.  Skin: Negative for rash.  Neurological: Negative for dizziness, tremors, speech difficulty and weakness.  Psychiatric/Behavioral: Negative for suicidal ideas, sleep disturbance, dysphoric mood and agitation. The patient is not nervous/anxious.    Wt Readings from Last 3 Encounters:  03/19/12 190 lb (86.183 kg)  02/19/12 191 lb 12.8 oz (87 kg)  01/21/12 186 lb 9.6 oz (84.641 kg)   BP Readings from Last 3 Encounters:  03/19/12 160/100  02/19/12 124/78  01/21/12 150/64         Objective:   Physical Exam  Constitutional: He is oriented to person, place, and time. He appears well-developed.  HENT:  Mouth/Throat: Oropharynx is clear and moist.  Eyes: Conjunctivae normal are normal. Pupils are equal, round, and reactive to light.  Neck: Normal range of motion. No JVD present. No thyromegaly present.  Cardiovascular: Normal rate, regular rhythm, normal heart sounds and intact distal pulses.  Exam reveals no gallop and no friction rub.   No murmur heard. Pulmonary/Chest: Effort normal and breath sounds normal. No respiratory distress. He has no wheezes. He has no rales. He exhibits no  tenderness.  Abdominal: Soft. Bowel sounds are normal. He exhibits no distension and no mass. There is no tenderness. There is no rebound and no guarding.  Musculoskeletal: Normal range of motion. He exhibits no edema and no tenderness.  Lymphadenopathy:    He has no cervical adenopathy.  Neurological: He is alert and oriented to person, place, and time. He has normal reflexes. No cranial nerve deficit. He exhibits normal muscle tone. Coordination normal.  Skin: Skin is warm and dry. No rash noted.  Psychiatric: He has a normal mood and affect. His behavior is normal. Judgment and thought content normal.    Lab Results  Component Value Date   WBC 5.1 11/19/2011   HGB 15.0 11/19/2011   HCT 42.7 11/19/2011   PLT 246 11/19/2011   GLUCOSE 107* 11/19/2011   CHOL 198 09/03/2010   TRIG 44.0 09/03/2010   HDL 56.30 09/03/2010   LDLCALC 133* 09/03/2010   ALT 28 09/03/2010   AST 29 09/03/2010   NA 138 11/19/2011   K 4.0 11/19/2011   CL 102 11/19/2011   CREATININE 0.64 11/19/2011   BUN 12 11/19/2011   CO2 25 11/19/2011   TSH 1.11 09/03/2010   PSA 0.89 09/10/2011         Assessment & Plan:

## 2012-03-19 NOTE — Assessment & Plan Note (Signed)
Trial of Viagra. 

## 2012-03-19 NOTE — Assessment & Plan Note (Signed)
Surgery is scheduled - Dr Annabell Howells

## 2012-03-19 NOTE — Assessment & Plan Note (Signed)
Better  

## 2012-03-19 NOTE — Assessment & Plan Note (Signed)
labs

## 2012-03-19 NOTE — Assessment & Plan Note (Signed)
12/14 - pt states BP is nl at home Continue with current prescription therapy as reflected on the Med list. BP Readings from Last 3 Encounters:  03/19/12 160/100  02/19/12 124/78  01/21/12 150/64

## 2012-03-31 ENCOUNTER — Encounter (HOSPITAL_BASED_OUTPATIENT_CLINIC_OR_DEPARTMENT_OTHER): Payer: Self-pay | Admitting: *Deleted

## 2012-03-31 NOTE — H&P (Signed)
ctive Problems Problems  1. 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 Austin Aguilar is a 57 yo WM who had a bilateral inguinal and umbilical hernia repair laparoscopically on 11/21/11.  He had some swelling in the scrotum post op and as the swelling abated he had a persistent right hydrocele.  He has had no pain but is a little uncomfortable. It has stabilized at about the size of a baseball.   He has had left hernia hydrocele treated in 1978.  He has no voiding difficulty but does have some increased frequency.   Past Medical History Problems  1. History of  Anxiety (Symptom) 300.00 2. History of  Colonic Diverticulosis 562.10 3. History of  Depression 311 4. History of  Hypertension 401.9  Surgical History Problems  1. History of  Inguinal Hernia Repair 2. History of  Shoulder Surgery 3. History of  Umbilical Hernia Repair  Current Meds 1. AmLODIPine Besylate TABS; Therapy: (Recorded:30Dec2013) to 2. Ascorbic Acid TABS; Therapy: (Recorded:30Dec2013) to 3. Aspirin 81 MG Oral Tablet; Therapy: (Recorded:30Dec2013) to 4. Fish Oil CAPS; Therapy: (Recorded:30Dec2013) to 5. Losartan Potassium 100 MG Oral Tablet; Therapy: (Recorded:30Dec2013) to 6. Multi Vitamin/Minerals TABS; Therapy: (Recorded:30Dec2013) to 7. TraZODone HCl 50 MG Oral Tablet; Therapy: (Recorded:30Dec2013) to  Allergies Medication  1. Hydrocodone-Acetaminophen TABS  Family History Problems  1. Fraternal history of  Alcohol Abuse 2. Sororal history of  Alcohol Abuse 3. Fraternal history of  Chronic Obstructive Pulmonary Disease 4. Paternal history of  Coronary Artery Disease V17.49 5. Fraternal history of  Coronary Artery Disease V17.49 6. Sororal history of  Drug Dependence 7. Maternal history of  Hyperlipidemia 8. Maternal history of  Hypertension V17.49 9. Fraternal history of  Hypertension V17.49 10. Maternal history of  Stroke Syndrome V17.1  Social History Problems    Alcohol Use    Former Smoker V15.82   Marital History - Currently Married   Occupation:  Review of Systems Genitourinary, constitutional, skin, eye, otolaryngeal, hematologic/lymphatic, cardiovascular, pulmonary, endocrine, musculoskeletal, gastrointestinal, neurological and psychiatric system(s) were reviewed and pertinent findings if present are noted.  Genitourinary: urinary frequency, nocturia and erectile dysfunction.    Vitals Vital Signs [Data Includes: Last 1 Day]  30Dec2013 11:42AM  Blood Pressure: 134 / 87 Temperature: 97.7 F Heart Rate: 72 30Dec2013 11:41AM  BMI Calculated: 26.96 BSA Calculated: 1.98 Height: 5 ft 9 in Weight: 182 lb   Physical Exam Constitutional: Well nourished and well developed . No acute distress.  ENT:. The ears and nose are normal in appearance.  Neck: The appearance of the neck is normal and no neck mass is present.  Pulmonary: No respiratory distress and normal respiratory rhythm and effort.  Cardiovascular: Heart rate and rhythm are normal . No peripheral edema.  Abdomen: The abdomen is soft and nontender. No masses are palpated. No CVA tenderness. No hernias are palpable. No hepatosplenomegaly noted.  Genitourinary: Examination of the penis demonstrates no discharge, no masses, no lesions and a normal meatus. The scrotum is normal in appearance and without lesions. Examination of the right scrotum demonstrates a hydrocele. Examination of the left scrotum demostrates no hydrocele. The left epididymis is palpably normal and non-tender. The right testis is nonpalpable. The left testis is non-tender and without masses.  Lymphatics: The femoral and inguinal nodes are not enlarged or tender.  Skin: Normal skin turgor, no visible rash and no visible skin lesions.  Neuro/Psych:. Mood and affect are appropriate.    Results/Data Urine [Data Includes: Last 1 Day]  30Dec2013  COLOR YELLOW   APPEARANCE CLEAR   SPECIFIC GRAVITY 1.015   pH 6.0   GLUCOSE NEG mg/dL    BILIRUBIN NEG   KETONE NEG mg/dL  BLOOD NEG   PROTEIN NEG mg/dL  UROBILINOGEN 0.2 mg/dL  NITRITE NEG   LEUKOCYTE ESTERASE NEG    Old records or history reviewed: I have reviewed records from Dr. Michaell Cowing.    Assessment  He has a symptomatic right hydrocele.   Plan Health Maintenance (V70.0)  1. UA With REFLEX  Done: 30Dec2013 10:55AM Testicular Hydrocele (603.9)  2. Follow-up Schedule Surgery Office  Follow-up  Requested for: 30Dec2013   I discussed surgical management and reviewed the risks of bleeding, infection, recurrent hydrocele, testicular injury, thrombotic events and anesthetic complications.  We will schedule him for January.   Discussion/Summary  CC: Dr. Karie Soda.

## 2012-03-31 NOTE — Progress Notes (Signed)
Pt instructed npo p mn 1/15 x norvasc w sip of water.  To [facility] 1/16 @ 1030.  Needs istat on arrival.

## 2012-04-02 ENCOUNTER — Ambulatory Visit (HOSPITAL_BASED_OUTPATIENT_CLINIC_OR_DEPARTMENT_OTHER)
Admission: RE | Admit: 2012-04-02 | Discharge: 2012-04-02 | Disposition: A | Discharge status: Home / Self Care | Payer: BC Managed Care – PPO | Source: Ambulatory Visit | Attending: Urology | Admitting: Urology

## 2012-04-02 ENCOUNTER — Ambulatory Visit (HOSPITAL_BASED_OUTPATIENT_CLINIC_OR_DEPARTMENT_OTHER): Payer: BC Managed Care – PPO | Admitting: Anesthesiology

## 2012-04-02 ENCOUNTER — Encounter (HOSPITAL_BASED_OUTPATIENT_CLINIC_OR_DEPARTMENT_OTHER): Payer: Self-pay | Admitting: Anesthesiology

## 2012-04-02 ENCOUNTER — Encounter (HOSPITAL_BASED_OUTPATIENT_CLINIC_OR_DEPARTMENT_OTHER): Payer: Self-pay | Admitting: *Deleted

## 2012-04-02 ENCOUNTER — Encounter (HOSPITAL_BASED_OUTPATIENT_CLINIC_OR_DEPARTMENT_OTHER)
Admission: RE | Disposition: A | Discharge status: Home / Self Care | Payer: Self-pay | Source: Ambulatory Visit | Attending: Urology

## 2012-04-02 DIAGNOSIS — N433 Hydrocele, unspecified: Secondary | ICD-10-CM | POA: Diagnosis present

## 2012-04-02 DIAGNOSIS — Z7982 Long term (current) use of aspirin: Secondary | ICD-10-CM | POA: Insufficient documentation

## 2012-04-02 DIAGNOSIS — Z79899 Other long term (current) drug therapy: Secondary | ICD-10-CM | POA: Insufficient documentation

## 2012-04-02 DIAGNOSIS — I10 Essential (primary) hypertension: Secondary | ICD-10-CM | POA: Insufficient documentation

## 2012-04-02 HISTORY — DX: Unspecified osteoarthritis, unspecified site: M19.90

## 2012-04-02 HISTORY — PX: HYDROCELE EXCISION: SHX482

## 2012-04-02 HISTORY — DX: Hydrocele, unspecified: N43.3

## 2012-04-02 LAB — POCT I-STAT 4, (NA,K, GLUC, HGB,HCT)
Glucose, Bld: 108 mg/dL — ABNORMAL HIGH (ref 70–99)
HCT: 43 % (ref 39.0–52.0)
Hemoglobin: 14.6 g/dL (ref 13.0–17.0)
Sodium: 143 mEq/L (ref 135–145)

## 2012-04-02 SURGERY — HYDROCELECTOMY
Anesthesia: General | Site: Scrotum | Laterality: Right | Wound class: Clean Contaminated

## 2012-04-02 MED ORDER — SODIUM CHLORIDE 0.9 % IJ SOLN
3.0000 mL | Freq: Two times a day (BID) | INTRAMUSCULAR | Status: DC
  Filled 2012-04-02: qty 3

## 2012-04-02 MED ORDER — ONDANSETRON HCL 4 MG/2ML IJ SOLN
4.0000 mg | Freq: Four times a day (QID) | INTRAMUSCULAR | Status: DC | PRN
  Filled 2012-04-02: qty 2

## 2012-04-02 MED ORDER — CEFAZOLIN SODIUM-DEXTROSE 2-3 GM-% IV SOLR
2.0000 g | INTRAVENOUS | Status: AC
  Administered 2012-04-02: 2 g via INTRAVENOUS
  Filled 2012-04-02: qty 50

## 2012-04-02 MED ORDER — MEPERIDINE HCL 25 MG/ML IJ SOLN
6.2500 mg | INTRAMUSCULAR | Status: DC | PRN
  Filled 2012-04-02: qty 1

## 2012-04-02 MED ORDER — OXYCODONE HCL 5 MG PO TABS
5.0000 mg | ORAL_TABLET | Freq: Once | ORAL | Status: AC | PRN
  Administered 2012-04-02: 5 mg via ORAL
  Filled 2012-04-02: qty 1

## 2012-04-02 MED ORDER — BUPIVACAINE HCL (PF) 0.25 % IJ SOLN
INTRAMUSCULAR | Status: DC | PRN
  Administered 2012-04-02: 8 mL

## 2012-04-02 MED ORDER — PROPOFOL 10 MG/ML IV BOLUS
INTRAVENOUS | Status: DC | PRN
  Administered 2012-04-02: 200 mg via INTRAVENOUS

## 2012-04-02 MED ORDER — LIDOCAINE HCL (CARDIAC) 20 MG/ML IV SOLN
INTRAVENOUS | Status: DC | PRN
  Administered 2012-04-02: 60 mg via INTRAVENOUS

## 2012-04-02 MED ORDER — ONDANSETRON HCL 4 MG/2ML IJ SOLN
INTRAMUSCULAR | Status: DC | PRN
  Administered 2012-04-02: 4 mg via INTRAVENOUS

## 2012-04-02 MED ORDER — MIDAZOLAM HCL 5 MG/5ML IJ SOLN
INTRAMUSCULAR | Status: DC | PRN
  Administered 2012-04-02: 2 mg via INTRAVENOUS

## 2012-04-02 MED ORDER — PROMETHAZINE HCL 25 MG/ML IJ SOLN
6.2500 mg | INTRAMUSCULAR | Status: DC | PRN
  Filled 2012-04-02: qty 1

## 2012-04-02 MED ORDER — ACETAMINOPHEN 325 MG PO TABS
650.0000 mg | ORAL_TABLET | ORAL | Status: DC | PRN
  Filled 2012-04-02: qty 2

## 2012-04-02 MED ORDER — DEXAMETHASONE SODIUM PHOSPHATE 4 MG/ML IJ SOLN
INTRAMUSCULAR | Status: DC | PRN
  Administered 2012-04-02: 10 mg via INTRAVENOUS

## 2012-04-02 MED ORDER — HYDROMORPHONE HCL PF 1 MG/ML IJ SOLN
0.2500 mg | INTRAMUSCULAR | Status: DC | PRN
  Filled 2012-04-02: qty 1

## 2012-04-02 MED ORDER — SODIUM CHLORIDE 0.9 % IV SOLN
250.0000 mL | INTRAVENOUS | Status: DC | PRN
  Filled 2012-04-02: qty 250

## 2012-04-02 MED ORDER — ACETAMINOPHEN 650 MG RE SUPP
650.0000 mg | RECTAL | Status: DC | PRN
  Filled 2012-04-02: qty 1

## 2012-04-02 MED ORDER — OXYCODONE HCL 5 MG/5ML PO SOLN
5.0000 mg | Freq: Once | ORAL | Status: AC | PRN
  Filled 2012-04-02: qty 5

## 2012-04-02 MED ORDER — FENTANYL CITRATE 0.05 MG/ML IJ SOLN
25.0000 ug | INTRAMUSCULAR | Status: DC | PRN
  Filled 2012-04-02: qty 1

## 2012-04-02 MED ORDER — OXYCODONE HCL 5 MG PO TABS
5.0000 mg | ORAL_TABLET | ORAL | Status: DC | PRN
  Filled 2012-04-02: qty 2

## 2012-04-02 MED ORDER — SODIUM CHLORIDE 0.9 % IJ SOLN
3.0000 mL | INTRAMUSCULAR | Status: DC | PRN
  Filled 2012-04-02: qty 3

## 2012-04-02 MED ORDER — FENTANYL CITRATE 0.05 MG/ML IJ SOLN
INTRAMUSCULAR | Status: DC | PRN
  Administered 2012-04-02: 100 ug via INTRAVENOUS
  Administered 2012-04-02 (×2): 50 ug via INTRAVENOUS

## 2012-04-02 MED ORDER — LACTATED RINGERS IV SOLN
INTRAVENOUS | Status: DC
  Administered 2012-04-02 (×2): via INTRAVENOUS
  Filled 2012-04-02: qty 1000

## 2012-04-02 MED ORDER — ACETAMINOPHEN 10 MG/ML IV SOLN
1000.0000 mg | Freq: Once | INTRAVENOUS | Status: DC | PRN
  Filled 2012-04-02: qty 100

## 2012-04-02 MED ORDER — HYDROCODONE-ACETAMINOPHEN 5-325 MG PO TABS: 1.0000 | ORAL_TABLET | Freq: Four times a day (QID) | ORAL | Status: DC | PRN

## 2012-04-02 SURGICAL SUPPLY — 35 items
BANDAGE GAUZE ELAST BULKY 4 IN (GAUZE/BANDAGES/DRESSINGS) ×2 IMPLANT
BLADE SURG 15 STRL LF DISP TIS (BLADE) ×1 IMPLANT
BLADE SURG 15 STRL SS (BLADE) ×2
BLADE SURG ROTATE 9660 (MISCELLANEOUS) ×2 IMPLANT
CANISTER SUCTION 1200CC (MISCELLANEOUS) IMPLANT
CANISTER SUCTION 2500CC (MISCELLANEOUS) ×1 IMPLANT
CLEANER CAUTERY TIP 5X5 PAD (MISCELLANEOUS) IMPLANT
CLOTH BEACON ORANGE TIMEOUT ST (SAFETY) ×2 IMPLANT
COVER MAYO STAND STRL (DRAPES) ×2 IMPLANT
COVER TABLE BACK 60X90 (DRAPES) ×2 IMPLANT
DISSECTOR ROUND CHERRY 3/8 STR (MISCELLANEOUS) IMPLANT
DRAIN PENROSE 18X1/4 LTX STRL (WOUND CARE) ×1 IMPLANT
DRAPE PED LAPAROTOMY (DRAPES) ×2 IMPLANT
ELECT REM PT RETURN 9FT ADLT (ELECTROSURGICAL) ×2
ELECTRODE REM PT RTRN 9FT ADLT (ELECTROSURGICAL) ×1 IMPLANT
GAUZE SPONGE 4X4 12PLY STRL LF (GAUZE/BANDAGES/DRESSINGS) ×1 IMPLANT
GLOVE BIO SURGEON STRL SZ 6.5 (GLOVE) ×2 IMPLANT
GLOVE ECLIPSE 6.0 STRL STRAW (GLOVE) ×1 IMPLANT
GLOVE SURG SS PI 8.0 STRL IVOR (GLOVE) ×2 IMPLANT
GOWN PREVENTION PLUS LG XLONG (DISPOSABLE) ×2 IMPLANT
GOWN STRL REIN XL XLG (GOWN DISPOSABLE) ×2 IMPLANT
NDL HYPO 25X1 1.5 SAFETY (NEEDLE) ×1 IMPLANT
NEEDLE HYPO 25X1 1.5 SAFETY (NEEDLE) ×2 IMPLANT
NS IRRIG 500ML POUR BTL (IV SOLUTION) ×2 IMPLANT
PACK BASIN DAY SURGERY FS (CUSTOM PROCEDURE TRAY) ×2 IMPLANT
PAD CLEANER CAUTERY TIP 5X5 (MISCELLANEOUS) ×1
PENCIL BUTTON HOLSTER BLD 10FT (ELECTRODE) ×2 IMPLANT
SUPPORT SCROTAL LG STRP (MISCELLANEOUS) ×2 IMPLANT
SUT CHROMIC 3 0 SH 27 (SUTURE) ×4 IMPLANT
SYR CONTROL 10ML LL (SYRINGE) ×2 IMPLANT
TOWEL OR 17X24 6PK STRL BLUE (TOWEL DISPOSABLE) ×4 IMPLANT
TRAY DSU PREP LF (CUSTOM PROCEDURE TRAY) ×2 IMPLANT
TUBE CONNECTING 12X1/4 (SUCTIONS) ×2 IMPLANT
WATER STERILE IRR 500ML POUR (IV SOLUTION) ×2 IMPLANT
YANKAUER SUCT BULB TIP NO VENT (SUCTIONS) ×2 IMPLANT

## 2012-04-02 NOTE — Brief Op Note (Signed)
04/02/2012  12:00 PM  PATIENT:  Billey Chang Ribeiro  57 y.o. male  PRE-OPERATIVE DIAGNOSIS:  RIGHT HYDROCELE  POST-OPERATIVE DIAGNOSIS:  RIGHT HYDROCELE  PROCEDURE:  Procedure(s) (LRB) with comments: HYDROCELECTOMY ADULT (Right)  SURGEON:  Surgeon(s) and Role:    * Anner Crete, MD - Primary  PHYSICIAN ASSISTANT:   ASSISTANTS: none   ANESTHESIA:   general  EBL:  Total I/O In: 800 [I.V.:800] Out: -   BLOOD ADMINISTERED:none  DRAINS: Penrose drain in the right scrotum   LOCAL MEDICATIONS USED:  MARCAINE 0.25%   and Amount: 8 ml  SPECIMEN:  No Specimen  DISPOSITION OF SPECIMEN:  N/A  COUNTS:  YES  TOURNIQUET:  * No tourniquets in log *  DICTATION: .Other Dictation: Dictation Number [id number]  PLAN OF CARE: Discharge to home after PACU  PATIENT DISPOSITION:  PACU - hemodynamically stable.   Delay start of Pharmacological VTE agent (>24hrs) due to surgical blood loss or risk of bleeding: not applicable

## 2012-04-02 NOTE — Interval H&P Note (Signed)
History and Physical Interval Note:  04/02/2012 10:51 AM  Austin Aguilar  has presented today for surgery, with the diagnosis of RIGHT HYDROCELE  The various methods of treatment have been discussed with the patient and family. After consideration of risks, benefits and other options for treatment, the patient has consented to  Procedure(s) (LRB) with comments: HYDROCELECTOMY ADULT (Right) as a surgical intervention .  The patient's history has been reviewed, patient examined, no change in status, stable for surgery.  I have reviewed the patient's chart and labs.  Questions were answered to the patient's satisfaction.     Hyun Marsalis J

## 2012-04-02 NOTE — Anesthesia Preprocedure Evaluation (Addendum)
Anesthesia Evaluation  Patient identified by MRN, date of birth, ID band Patient awake    Reviewed: Allergy & Precautions, H&P , NPO status , Patient's Chart, lab work & pertinent test results  Airway Mallampati: II TM Distance: <3 FB Neck ROM: Full    Dental No notable dental hx. (+) Dental Advisory Given   Pulmonary neg pulmonary ROS, former smoker,  breath sounds clear to auscultation  Pulmonary exam normal       Cardiovascular hypertension, Pt. on medications Rhythm:Regular Rate:Normal     Neuro/Psych PSYCHIATRIC DISORDERS Anxiety Depression negative neurological ROS  negative psych ROS   GI/Hepatic negative GI ROS, Neg liver ROS,   Endo/Other  negative endocrine ROS  Renal/GU negative Renal ROS  negative genitourinary   Musculoskeletal negative musculoskeletal ROS (+)   Abdominal   Peds negative pediatric ROS (+)  Hematology negative hematology ROS (+)   Anesthesia Other Findings   Reproductive/Obstetrics                         Anesthesia Physical Anesthesia Plan  ASA: II  Anesthesia Plan: General   Post-op Pain Management:    Induction: Intravenous  Airway Management Planned: LMA  Additional Equipment:   Intra-op Plan:   Post-operative Plan: Extubation in OR  Informed Consent: I have reviewed the patients History and Physical, chart, labs and discussed the procedure including the risks, benefits and alternatives for the proposed anesthesia with the patient or authorized representative who has indicated his/her understanding and acceptance.   Dental advisory given  Plan Discussed with: CRNA  Anesthesia Plan Comments:         Anesthesia Quick Evaluation                                   Anesthesia Evaluation  Patient identified by MRN, date of birth, ID band Patient awake    Reviewed: Allergy & Precautions, H&P , NPO status , Patient's Chart, lab work &  pertinent test results  Airway Mallampati: II TM Distance: <3 FB Neck ROM: Full    Dental No notable dental hx.    Pulmonary neg pulmonary ROS,  breath sounds clear to auscultation  Pulmonary exam normal       Cardiovascular hypertension, Pt. on medications Rhythm:Regular Rate:Normal     Neuro/Psych negative neurological ROS  negative psych ROS   GI/Hepatic negative GI ROS, Neg liver ROS,   Endo/Other  negative endocrine ROS  Renal/GU negative Renal ROS  negative genitourinary   Musculoskeletal negative musculoskeletal ROS (+)   Abdominal   Peds negative pediatric ROS (+)  Hematology negative hematology ROS (+)   Anesthesia Other Findings   Reproductive/Obstetrics negative OB ROS                           Anesthesia Physical Anesthesia Plan  ASA: II  Anesthesia Plan: General   Post-op Pain Management:    Induction: Intravenous  Airway Management Planned: Oral ETT  Additional Equipment:   Intra-op Plan:   Post-operative Plan: Extubation in OR  Informed Consent: I have reviewed the patients History and Physical, chart, labs and discussed the procedure including the risks, benefits and alternatives for the proposed anesthesia with the patient or authorized representative who has indicated his/her understanding and acceptance.   Dental advisory given  Plan Discussed with: CRNA and Surgeon  Anesthesia Plan Comments:  Anesthesia Quick Evaluation  

## 2012-04-02 NOTE — Transfer of Care (Signed)
Immediate Anesthesia Transfer of Care Note  Patient: Austin Aguilar  Procedure(s) Performed: Procedure(s) (LRB): HYDROCELECTOMY ADULT (Right)  Patient Location: PACU  Anesthesia Type: General  Level of Consciousness: awake, alert  and oriented  Airway & Oxygen Therapy: Patient Spontanous Breathing and Patient connected to face mask oxygen  Post-op Assessment: Report given to PACU RN and Post -op Vital signs reviewed and stable  Post vital signs: Reviewed and stable  Complications: No apparent anesthesia complications

## 2012-04-02 NOTE — Anesthesia Procedure Notes (Signed)
Procedure Name: LMA Insertion Date/Time: 04/02/2012 11:16 AM Performed by: Norva Pavlov Pre-anesthesia Checklist: Patient identified, Emergency Drugs available, Suction available and Patient being monitored Patient Re-evaluated:Patient Re-evaluated prior to inductionOxygen Delivery Method: Circle System Utilized Preoxygenation: Pre-oxygenation with 100% oxygen Intubation Type: IV induction Ventilation: Mask ventilation without difficulty LMA: LMA inserted LMA Size: 4.0 Number of attempts: 1 Airway Equipment and Method: bite block Placement Confirmation: positive ETCO2 Tube secured with: Tape Dental Injury: Teeth and Oropharynx as per pre-operative assessment

## 2012-04-02 NOTE — Anesthesia Postprocedure Evaluation (Signed)
Anesthesia Post Note  Patient: Austin Aguilar  Procedure(s) Performed: Procedure(s) (LRB): HYDROCELECTOMY ADULT (Right)  Anesthesia type: General  Patient location: PACU  Post pain: Pain level controlled  Post assessment: Post-op Vital signs reviewed  Last Vitals: BP 131/73  Pulse 69  Temp 36.9 C (Oral)  Resp 16  Ht 5\' 9"  (1.753 m)  Wt 185 lb 5 oz (84.057 kg)  BMI 27.37 kg/m2  SpO2 97%  Post vital signs: Reviewed  Level of consciousness: sedated  Complications: No apparent anesthesia complications

## 2012-04-03 ENCOUNTER — Encounter (HOSPITAL_BASED_OUTPATIENT_CLINIC_OR_DEPARTMENT_OTHER): Payer: Self-pay | Admitting: Urology

## 2012-04-03 NOTE — Op Note (Signed)
NAMEMICKLE, CAMPTON NO.:  [id number]  MEDICAL RECORD NO.:  [id number]  LOCATION:                                 FACILITY:  PHYSICIAN:  Austin Aguilar, M.D.    DATE OF BIRTH:  1955/05/17  DATE OF PROCEDURE:  04/02/2012 DATE OF DISCHARGE:                              OPERATIVE REPORT   PROCEDURE:  Right hydrocelectomy.  PREOPERATIVE DIAGNOSIS:  Right hydrocele.  POSTOPERATIVE DIAGNOSIS:  Right hydrocele.  SURGEON:  Austin Aguilar, M.D.  ANESTHESIA:  General.  SPECIMEN:  None.  DRAINS:  Quarter-inch Penrose.  BLOOD LOSS:  Minimal.  COMPLICATIONS:  None.  INDICATIONS:  Austin Aguilar is a 57 year old white male with a symptomatic right hydrocele.  He has elected surgical correction.  FINDINGS OF PROCEDURE:  He was given 2 g of Ancef.  He was taken to the operating room, where general anesthetic was induced.  His genitalia were clipped.  He was prepped with Betadine solution and draped in usual sterile fashion.  An oblique right anterior scrotal incision was made along skin lines with the knife.  The dartos muscle was incised with the Bovie and the hydrocele sac was delivered from the scrotum.  The sac was opened and drained of approximately 150 mL of fluid.  The residual sac was quite thin and the redundant portion was excised with the Bovie used to provide hemostasis on the edges of the sac.  The redundant sac was then imbricated behind the testicle in a water bottle fashion using a 3-0 running locked chromic.  Once the hydrocele had been corrected, the right hemiscrotum was inspected.  No active bleeding was noted.  A quarter-inch Penrose drain was placed through a separate stab wound in the dependent portion of the right scrotum and placed adjacent to the testicle which was returned to the scrotum.  At this point, the dartos was closed using a running 3-0 chromic and the skin with interrupted vertical mattress 3-0 chromic.  The wound  and spermatic cord were infiltrated with 8 mL of 0.25% Marcaine for postop pain relief.  At this point, the wound was clean and a dressing was applied.  The patient's anesthetic was reversed.  He was moved to the recovery room in stable condition.  There were no complications.     Austin Aguilar, M.D.     JJW/MEDQ  D:  04/02/2012  T:  04/02/2012  Job:  [id number]

## 2012-09-16 ENCOUNTER — Ambulatory Visit (INDEPENDENT_AMBULATORY_CARE_PROVIDER_SITE_OTHER): Payer: BC Managed Care – PPO | Admitting: Internal Medicine

## 2012-09-16 ENCOUNTER — Encounter: Payer: Self-pay | Admitting: Internal Medicine

## 2012-09-16 VITALS — BP 140/70 | HR 72 | Temp 98.1°F | Resp 16 | Wt 183.0 lb

## 2012-09-16 DIAGNOSIS — F411 Generalized anxiety disorder: Secondary | ICD-10-CM

## 2012-09-16 DIAGNOSIS — I10 Essential (primary) hypertension: Secondary | ICD-10-CM

## 2012-09-16 DIAGNOSIS — N529 Male erectile dysfunction, unspecified: Secondary | ICD-10-CM

## 2012-09-16 DIAGNOSIS — G4700 Insomnia, unspecified: Secondary | ICD-10-CM

## 2012-09-16 MED ORDER — SILDENAFIL CITRATE 100 MG PO TABS: 100.0000 mg | ORAL_TABLET | ORAL | Status: DC | PRN

## 2012-09-16 NOTE — Progress Notes (Signed)
   Subjective:    HPI  C/o ED - Viagra is too $$$  The patient is here to follow up on chronic depression, anxiety, HTN controlled with medicines. SBP at home is better    Review of Systems  Constitutional: Negative for appetite change, fatigue and unexpected weight change.  HENT: Negative for nosebleeds, congestion, sore throat, sneezing, trouble swallowing and neck pain.   Eyes: Negative for itching and visual disturbance.  Respiratory: Negative for cough.   Cardiovascular: Negative for chest pain, palpitations and leg swelling.  Gastrointestinal: Negative for nausea, diarrhea, blood in stool and abdominal distention.  Genitourinary: Negative for frequency and hematuria.  Musculoskeletal: Negative for back pain, joint swelling and gait problem.  Skin: Negative for rash.  Neurological: Negative for dizziness, tremors, speech difficulty and weakness.  Psychiatric/Behavioral: Negative for suicidal ideas, sleep disturbance, dysphoric mood and agitation. The patient is not nervous/anxious.    Wt Readings from Last 3 Encounters:  09/16/12 183 lb (83.008 kg)  04/02/12 185 lb 5 oz (84.057 kg)  04/02/12 185 lb 5 oz (84.057 kg)   BP Readings from Last 3 Encounters:  09/16/12 140/90  04/02/12 151/90  04/02/12 151/90         Objective:   Physical Exam  Constitutional: He is oriented to person, place, and time. He appears well-developed.  HENT:  Mouth/Throat: Oropharynx is clear and moist.  Eyes: Conjunctivae are normal. Pupils are equal, round, and reactive to light.  Neck: Normal range of motion. No JVD present. No thyromegaly present.  Cardiovascular: Normal rate, regular rhythm, normal heart sounds and intact distal pulses.  Exam reveals no gallop and no friction rub.   No murmur heard. Pulmonary/Chest: Effort normal and breath sounds normal. No respiratory distress. He has no wheezes. He has no rales. He exhibits no tenderness.  Abdominal: Soft. Bowel sounds are normal. He  exhibits no distension and no mass. There is no tenderness. There is no rebound and no guarding.  Musculoskeletal: Normal range of motion. He exhibits no edema and no tenderness.  Lymphadenopathy:    He has no cervical adenopathy.  Neurological: He is alert and oriented to person, place, and time. He has normal reflexes. No cranial nerve deficit. He exhibits normal muscle tone. Coordination normal.  Skin: Skin is warm and dry. No rash noted.  Psychiatric: He has a normal mood and affect. His behavior is normal. Judgment and thought content normal.    Lab Results  Component Value Date   WBC 5.1 11/19/2011   HGB 14.6 04/02/2012   HCT 43.0 04/02/2012   PLT 246 11/19/2011   GLUCOSE 108* 04/02/2012   CHOL 198 09/03/2010   TRIG 44.0 09/03/2010   HDL 56.30 09/03/2010   LDLCALC 133* 09/03/2010   ALT 28 09/03/2010   AST 29 09/03/2010   NA 143 04/02/2012   K 3.7 04/02/2012   CL 102 11/19/2011   CREATININE 0.64 11/19/2011   BUN 12 11/19/2011   CO2 25 11/19/2011   TSH 1.11 09/03/2010   PSA 0.89 09/10/2011         Assessment & Plan:

## 2012-09-16 NOTE — Assessment & Plan Note (Signed)
Continue with current prescription therapy as reflected on the Med list.  

## 2012-09-16 NOTE — Assessment & Plan Note (Signed)
Continue with current prescription prn therapy as reflected on the Med list.  

## 2012-09-16 NOTE — Assessment & Plan Note (Signed)
Better  

## 2012-11-04 ENCOUNTER — Other Ambulatory Visit: Payer: Self-pay | Admitting: Internal Medicine

## 2012-11-05 ENCOUNTER — Other Ambulatory Visit: Payer: Self-pay | Admitting: Internal Medicine

## 2012-12-13 ENCOUNTER — Other Ambulatory Visit: Payer: Self-pay | Admitting: Internal Medicine

## 2013-03-22 ENCOUNTER — Ambulatory Visit (INDEPENDENT_AMBULATORY_CARE_PROVIDER_SITE_OTHER): Payer: BC Managed Care – PPO | Admitting: Internal Medicine

## 2013-03-22 ENCOUNTER — Other Ambulatory Visit (INDEPENDENT_AMBULATORY_CARE_PROVIDER_SITE_OTHER): Payer: BC Managed Care – PPO

## 2013-03-22 ENCOUNTER — Encounter: Payer: Self-pay | Admitting: Internal Medicine

## 2013-03-22 VITALS — BP 142/98 | HR 68 | Temp 97.9°F | Resp 16 | Ht 69.0 in | Wt 192.0 lb

## 2013-03-22 DIAGNOSIS — I10 Essential (primary) hypertension: Secondary | ICD-10-CM

## 2013-03-22 DIAGNOSIS — Z0000 Encounter for general adult medical examination without abnormal findings: Secondary | ICD-10-CM

## 2013-03-22 DIAGNOSIS — N529 Male erectile dysfunction, unspecified: Secondary | ICD-10-CM

## 2013-03-22 DIAGNOSIS — F411 Generalized anxiety disorder: Secondary | ICD-10-CM

## 2013-03-22 DIAGNOSIS — Z23 Encounter for immunization: Secondary | ICD-10-CM

## 2013-03-22 LAB — BASIC METABOLIC PANEL
BUN: 16 mg/dL (ref 6–23)
CALCIUM: 9.6 mg/dL (ref 8.4–10.5)
CHLORIDE: 104 meq/L (ref 96–112)
CO2: 24 meq/L (ref 19–32)
Creatinine, Ser: 0.8 mg/dL (ref 0.4–1.5)
GFR: 112.38 mL/min (ref >60.00)
Glucose, Bld: 104 mg/dL — ABNORMAL HIGH (ref 70–99)
POTASSIUM: 3.8 meq/L (ref 3.5–5.1)
SODIUM: 136 meq/L (ref 135–145)

## 2013-03-22 LAB — HEPATIC FUNCTION PANEL
ALK PHOS: 55 U/L (ref 39–117)
ALT: 28 U/L (ref 0–53)
AST: 29 U/L (ref 0–37)
Albumin: 5 g/dL (ref 3.5–5.2)
BILIRUBIN DIRECT: 0.1 mg/dL (ref 0.0–0.3)
TOTAL PROTEIN: 7.4 g/dL (ref 6.0–8.3)
Total Bilirubin: 1 mg/dL (ref 0.3–1.2)

## 2013-03-22 LAB — CBC WITH DIFFERENTIAL/PLATELET
BASOS PCT: 0.6 % (ref 0.0–3.0)
Basophils Absolute: 0 10*3/uL (ref 0.0–0.1)
EOS ABS: 0.1 10*3/uL (ref 0.0–0.7)
Eosinophils Relative: 1.4 % (ref 0.0–5.0)
HCT: 41 % (ref 39.0–52.0)
HEMOGLOBIN: 14.3 g/dL (ref 13.0–17.0)
LYMPHS PCT: 26 % (ref 12.0–46.0)
Lymphs Abs: 1.6 10*3/uL (ref 0.7–4.0)
MCHC: 34.8 g/dL (ref 30.0–36.0)
MCV: 91.3 fl (ref 78.0–100.0)
Monocytes Absolute: 0.4 10*3/uL (ref 0.1–1.0)
Monocytes Relative: 7.1 % (ref 3.0–12.0)
NEUTROS ABS: 4.1 10*3/uL (ref 1.4–7.7)
Neutrophils Relative %: 64.9 % (ref 43.0–77.0)
Platelets: 233 10*3/uL (ref 150.0–400.0)
RBC: 4.49 Mil/uL (ref 4.22–5.81)
RDW: 11.9 % (ref 11.5–14.6)
WBC: 6.3 10*3/uL (ref 4.5–10.5)

## 2013-03-22 LAB — URINALYSIS
Bilirubin Urine: NEGATIVE
Hgb urine dipstick: NEGATIVE
Leukocytes, UA: NEGATIVE
Nitrite: NEGATIVE
SPECIFIC GRAVITY, URINE: 1.02 (ref 1.000–1.030)
Total Protein, Urine: NEGATIVE
URINE GLUCOSE: NEGATIVE
UROBILINOGEN UA: 0.2 (ref 0.0–1.0)
pH: 6.5 (ref 5.0–8.0)

## 2013-03-22 LAB — TSH: TSH: 1.68 u[IU]/mL (ref 0.35–5.50)

## 2013-03-22 MED ORDER — AVANAFIL 100 MG PO TABS: 100.0000 mg | ORAL_TABLET | Freq: Every day | ORAL | Status: DC | PRN

## 2013-03-22 MED ORDER — VITAMIN D 1000 UNITS PO TABS: 1000.0000 [IU] | ORAL_TABLET | Freq: Every day | ORAL | Status: AC

## 2013-03-22 NOTE — Progress Notes (Signed)
   Subjective:    HPI  The patient is here for a wellness exam. The patient has been doing well overall without major physical or psychological issues going on lately. F/u ED - meds are too $$$  The patient is here to follow up on chronic depression, anxiety, HTN controlled with medicines    Review of Systems  Constitutional: Negative for appetite change, fatigue and unexpected weight change.  HENT: Negative for congestion, nosebleeds, sneezing, sore throat and trouble swallowing.   Eyes: Negative for itching and visual disturbance.  Respiratory: Negative for cough.   Cardiovascular: Negative for chest pain, palpitations and leg swelling.  Gastrointestinal: Negative for nausea, diarrhea, blood in stool and abdominal distention.  Genitourinary: Negative for frequency and hematuria.  Musculoskeletal: Negative for back pain, gait problem, joint swelling and neck pain.  Skin: Negative for rash.  Neurological: Negative for dizziness, tremors, speech difficulty and weakness.  Psychiatric/Behavioral: Negative for suicidal ideas, sleep disturbance, dysphoric mood and agitation. The patient is not nervous/anxious.    Wt Readings from Last 3 Encounters:  03/22/13 192 lb (87.091 kg)  09/16/12 183 lb (83.008 kg)  04/02/12 185 lb 5 oz (84.057 kg)   BP Readings from Last 3 Encounters:  03/22/13 142/98  09/16/12 140/70  04/02/12 151/90         Objective:   Physical Exam  Constitutional: He is oriented to person, place, and time. He appears well-developed.  HENT:  Mouth/Throat: Oropharynx is clear and moist.  Eyes: Conjunctivae are normal. Pupils are equal, round, and reactive to light.  Neck: Normal range of motion. No JVD present. No thyromegaly present.  Cardiovascular: Normal rate, regular rhythm, normal heart sounds and intact distal pulses.  Exam reveals no gallop and no friction rub.   No murmur heard. Pulmonary/Chest: Effort normal and breath sounds normal. No respiratory  distress. He has no wheezes. He has no rales. He exhibits no tenderness.  Abdominal: Soft. Bowel sounds are normal. He exhibits no distension and no mass. There is no tenderness. There is no rebound and no guarding.  Musculoskeletal: Normal range of motion. He exhibits no edema and no tenderness.  Lymphadenopathy:    He has no cervical adenopathy.  Neurological: He is alert and oriented to person, place, and time. He has normal reflexes. No cranial nerve deficit. He exhibits normal muscle tone. Coordination normal.  Skin: Skin is warm and dry. No rash noted.  Psychiatric: He has a normal mood and affect. His behavior is normal. Judgment and thought content normal.    Lab Results  Component Value Date   WBC 5.1 11/19/2011   HGB 14.6 04/02/2012   HCT 43.0 04/02/2012   PLT 246 11/19/2011   GLUCOSE 108* 04/02/2012   CHOL 198 09/03/2010   TRIG 44.0 09/03/2010   HDL 56.30 09/03/2010   LDLCALC 133* 09/03/2010   ALT 28 09/03/2010   AST 29 09/03/2010   NA 143 04/02/2012   K 3.7 04/02/2012   CL 102 11/19/2011   CREATININE 0.64 11/19/2011   BUN 12 11/19/2011   CO2 25 11/19/2011   TSH 1.11 09/03/2010   PSA 0.89 09/10/2011         Assessment & Plan:

## 2013-03-22 NOTE — Progress Notes (Signed)
Pre visit review using our clinic review tool, if applicable. No additional management support is needed unless otherwise documented below in the visit note. 

## 2013-03-22 NOTE — Assessment & Plan Note (Addendum)
Continue with current prescription therapy as reflected on the Med list. Loose wt 

## 2013-03-22 NOTE — Assessment & Plan Note (Signed)
We discussed age appropriate health related issues, including available/recomended screening tests and vaccinations. We discussed a need for adhering to healthy diet and exercise. Labs/EKG were reviewed/ordered. All questions were answered.   

## 2013-03-22 NOTE — Assessment & Plan Note (Signed)
Doing well Continue with current prescription therapy as reflected on the Med list.  

## 2013-03-22 NOTE — Assessment & Plan Note (Signed)
Will try [place]

## 2013-05-18 ENCOUNTER — Other Ambulatory Visit: Payer: Self-pay | Admitting: *Deleted

## 2013-05-18 MED ORDER — AMLODIPINE BESYLATE 5 MG PO TABS: ORAL_TABLET | ORAL | Status: DC

## 2013-05-20 ENCOUNTER — Telehealth: Payer: Self-pay | Admitting: Internal Medicine

## 2013-05-20 NOTE — Telephone Encounter (Signed)
Rec'd from [facility] forward 7 pages to Dr. Alain Marion

## 2013-06-08 ENCOUNTER — Other Ambulatory Visit: Payer: Self-pay

## 2013-06-08 MED ORDER — LOSARTAN POTASSIUM 100 MG PO TABS: 100.0000 mg | ORAL_TABLET | Freq: Every day | ORAL | Status: DC

## 2013-06-08 NOTE — Telephone Encounter (Signed)
rx for losartan 100 mg refilled

## 2013-08-28 ENCOUNTER — Other Ambulatory Visit: Payer: Self-pay | Admitting: Internal Medicine

## 2014-03-23 ENCOUNTER — Ambulatory Visit (INDEPENDENT_AMBULATORY_CARE_PROVIDER_SITE_OTHER): Payer: BLUE CROSS/BLUE SHIELD | Admitting: Internal Medicine

## 2014-03-23 ENCOUNTER — Encounter: Payer: Self-pay | Admitting: Internal Medicine

## 2014-03-23 ENCOUNTER — Other Ambulatory Visit (INDEPENDENT_AMBULATORY_CARE_PROVIDER_SITE_OTHER): Payer: BLUE CROSS/BLUE SHIELD

## 2014-03-23 VITALS — BP 158/80 | HR 77 | Temp 98.1°F | Ht 68.0 in | Wt 188.0 lb

## 2014-03-23 DIAGNOSIS — Z0000 Encounter for general adult medical examination without abnormal findings: Secondary | ICD-10-CM

## 2014-03-23 LAB — CBC WITH DIFFERENTIAL/PLATELET
BASOS PCT: 0.5 % (ref 0.0–3.0)
Basophils Absolute: 0 10*3/uL (ref 0.0–0.1)
EOS ABS: 0.1 10*3/uL (ref 0.0–0.7)
Eosinophils Relative: 0.9 % (ref 0.0–5.0)
HCT: 44.1 % (ref 39.0–52.0)
HEMOGLOBIN: 14.9 g/dL (ref 13.0–17.0)
Lymphocytes Relative: 23.8 % (ref 12.0–46.0)
Lymphs Abs: 1.6 10*3/uL (ref 0.7–4.0)
MCHC: 33.7 g/dL (ref 30.0–36.0)
MCV: 93.4 fl (ref 78.0–100.0)
Monocytes Absolute: 0.5 10*3/uL (ref 0.1–1.0)
Monocytes Relative: 7.5 % (ref 3.0–12.0)
Neutro Abs: 4.6 10*3/uL (ref 1.4–7.7)
Neutrophils Relative %: 67.3 % (ref 43.0–77.0)
PLATELETS: 296 10*3/uL (ref 150.0–400.0)
RBC: 4.73 Mil/uL (ref 4.22–5.81)
RDW: 12.4 % (ref 11.5–15.5)
WBC: 6.8 10*3/uL (ref 4.0–10.5)

## 2014-03-23 LAB — HEPATIC FUNCTION PANEL
ALT: 26 U/L (ref 0–53)
AST: 27 U/L (ref 0–37)
Albumin: 5 g/dL (ref 3.5–5.2)
Alkaline Phosphatase: 72 U/L (ref 39–117)
Bilirubin, Direct: 0.1 mg/dL (ref 0.0–0.3)
Total Bilirubin: 0.8 mg/dL (ref 0.2–1.2)
Total Protein: 7.5 g/dL (ref 6.0–8.3)

## 2014-03-23 LAB — URINALYSIS
Bilirubin Urine: NEGATIVE
Hgb urine dipstick: NEGATIVE
Ketones, ur: NEGATIVE
Leukocytes, UA: NEGATIVE
Nitrite: NEGATIVE
SPECIFIC GRAVITY, URINE: 1.015 (ref 1.000–1.030)
Total Protein, Urine: NEGATIVE
Urine Glucose: NEGATIVE
Urobilinogen, UA: 0.2 (ref 0.0–1.0)
pH: 6 (ref 5.0–8.0)

## 2014-03-23 LAB — PSA: PSA: 0.97 ng/mL (ref 0.10–4.00)

## 2014-03-23 LAB — LIPID PANEL
CHOL/HDL RATIO: 5
Cholesterol: 219 mg/dL — ABNORMAL HIGH (ref 0–200)
HDL: 47.7 mg/dL (ref >39.00)
LDL Cholesterol: 143 mg/dL — ABNORMAL HIGH (ref 0–99)
NONHDL: 171.3
Triglycerides: 144 mg/dL (ref 0.0–149.0)
VLDL: 28.8 mg/dL (ref 0.0–40.0)

## 2014-03-23 LAB — BASIC METABOLIC PANEL
BUN: 11 mg/dL (ref 6–23)
CALCIUM: 10.1 mg/dL (ref 8.4–10.5)
CO2: 28 mEq/L (ref 19–32)
Chloride: 103 mEq/L (ref 96–112)
Creatinine, Ser: 0.7 mg/dL (ref 0.4–1.5)
GFR: 119.19 mL/min (ref >60.00)
Glucose, Bld: 102 mg/dL — ABNORMAL HIGH (ref 70–99)
Potassium: 4 mEq/L (ref 3.5–5.1)
Sodium: 137 mEq/L (ref 135–145)

## 2014-03-23 LAB — TSH: TSH: 1.48 u[IU]/mL (ref 0.35–4.50)

## 2014-03-23 MED ORDER — SILDENAFIL CITRATE 100 MG PO TABS: 100.0000 mg | ORAL_TABLET | ORAL | Status: DC | PRN

## 2014-03-23 MED ORDER — TRAZODONE HCL 50 MG PO TABS: 50.0000 mg | ORAL_TABLET | Freq: Every day | ORAL | Status: DC

## 2014-03-23 NOTE — Progress Notes (Signed)
Pre visit review using our clinic review tool, if applicable. No additional management support is needed unless otherwise documented below in the visit note. 

## 2014-03-23 NOTE — Progress Notes (Signed)
   Subjective:    HPI  The patient is here for a wellness exam. F/u ED   The patient is here to follow up on chronic depression, anxiety, HTN controlled with medicines    Review of Systems  Constitutional: Negative for appetite change, fatigue and unexpected weight change.  HENT: Negative for congestion, nosebleeds, sneezing, sore throat and trouble swallowing.   Eyes: Negative for itching and visual disturbance.  Respiratory: Negative for cough.   Cardiovascular: Negative for chest pain, palpitations and leg swelling.  Gastrointestinal: Negative for nausea, diarrhea, blood in stool and abdominal distention.  Genitourinary: Negative for frequency and hematuria.  Musculoskeletal: Negative for back pain, joint swelling, gait problem and neck pain.  Skin: Negative for rash.  Neurological: Negative for dizziness, tremors, speech difficulty and weakness.  Psychiatric/Behavioral: Negative for suicidal ideas, sleep disturbance, dysphoric mood and agitation. The patient is not nervous/anxious.    Wt Readings from Last 3 Encounters:  03/23/14 188 lb (85.276 kg)  03/22/13 192 lb (87.091 kg)  09/16/12 183 lb (83.008 kg)   BP Readings from Last 3 Encounters:  03/23/14 158/80  03/22/13 142/98  09/16/12 140/70         Objective:   Physical Exam  Constitutional: He is oriented to person, place, and time. He appears well-developed and well-nourished. No distress.  HENT:  Head: Normocephalic and atraumatic.  Right Ear: External ear normal.  Left Ear: External ear normal.  Nose: Nose normal.  Mouth/Throat: Oropharynx is clear and moist. No oropharyngeal exudate.  Eyes: Conjunctivae and EOM are normal. Pupils are equal, round, and reactive to light. Right eye exhibits no discharge. Left eye exhibits no discharge. No scleral icterus.  Neck: Normal range of motion. Neck supple. No JVD present. No tracheal deviation present. No thyromegaly present.  Cardiovascular: Normal rate, regular  rhythm, normal heart sounds and intact distal pulses.  Exam reveals no gallop and no friction rub.   No murmur heard. Pulmonary/Chest: Effort normal and breath sounds normal. No stridor. No respiratory distress. He has no wheezes. He has no rales. He exhibits no tenderness.  Abdominal: Soft. Bowel sounds are normal. He exhibits no distension and no mass. There is no tenderness. There is no rebound and no guarding.  Genitourinary: Rectum normal, prostate normal and penis normal. Guaiac negative stool. No penile tenderness.  Musculoskeletal: Normal range of motion. He exhibits no edema or tenderness.  Lymphadenopathy:    He has no cervical adenopathy.  Neurological: He is alert and oriented to person, place, and time. He has normal reflexes. No cranial nerve deficit. He exhibits normal muscle tone. Coordination normal.  Skin: Skin is warm and dry. No rash noted. He is not diaphoretic. No erythema. No pallor.  Psychiatric: He has a normal mood and affect. His behavior is normal. Judgment and thought content normal.    Lab Results  Component Value Date   WBC 6.3 03/22/2013   HGB 14.3 03/22/2013   HCT 41.0 03/22/2013   PLT 233.0 03/22/2013   GLUCOSE 104* 03/22/2013   CHOL 198 09/03/2010   TRIG 44.0 09/03/2010   HDL 56.30 09/03/2010   LDLCALC 133* 09/03/2010   ALT 28 03/22/2013   AST 29 03/22/2013   NA 136 03/22/2013   K 3.8 03/22/2013   CL 104 03/22/2013   CREATININE 0.8 03/22/2013   BUN 16 03/22/2013   CO2 24 03/22/2013   TSH 1.68 03/22/2013   PSA 0.89 09/10/2011         Assessment & Plan:

## 2014-03-23 NOTE — Assessment & Plan Note (Signed)
We discussed age appropriate health related issues, including available/recomended screening tests and vaccinations. We discussed a need for adhering to healthy diet and exercise. Labs/EKG were reviewed/ordered. All questions were answered.   

## 2014-07-12 ENCOUNTER — Telehealth: Payer: Self-pay | Admitting: Internal Medicine

## 2014-07-12 NOTE — Telephone Encounter (Signed)
Please advise, thanks.

## 2014-07-14 ENCOUNTER — Other Ambulatory Visit: Payer: Self-pay

## 2014-07-14 MED ORDER — LOSARTAN POTASSIUM 100 MG PO TABS: 100.0000 mg | ORAL_TABLET | Freq: Every day | ORAL | Status: DC

## 2014-07-14 NOTE — Telephone Encounter (Signed)
Pharmacy is calling back about the losartan (COZAAR) 100 MG tablet [[id number] refill

## 2014-07-14 NOTE — Telephone Encounter (Signed)
Called [facility]---they said it looked like [facility] but [facility] records do not indicate this, pt was last seen in Jan 2016, [facility] [address]

## 2014-10-17 ENCOUNTER — Other Ambulatory Visit: Payer: Self-pay | Admitting: Internal Medicine

## 2015-03-27 ENCOUNTER — Encounter: Payer: BLUE CROSS/BLUE SHIELD | Admitting: Internal Medicine

## 2015-04-05 ENCOUNTER — Ambulatory Visit (INDEPENDENT_AMBULATORY_CARE_PROVIDER_SITE_OTHER): Payer: 59 | Admitting: Internal Medicine

## 2015-04-05 ENCOUNTER — Other Ambulatory Visit (INDEPENDENT_AMBULATORY_CARE_PROVIDER_SITE_OTHER): Payer: 59

## 2015-04-05 ENCOUNTER — Encounter: Payer: Self-pay | Admitting: Internal Medicine

## 2015-04-05 VITALS — BP 150/90 | HR 90 | Ht 68.0 in | Wt 193.0 lb

## 2015-04-05 DIAGNOSIS — Z0000 Encounter for general adult medical examination without abnormal findings: Secondary | ICD-10-CM | POA: Diagnosis not present

## 2015-04-05 DIAGNOSIS — G4700 Insomnia, unspecified: Secondary | ICD-10-CM

## 2015-04-05 DIAGNOSIS — I10 Essential (primary) hypertension: Secondary | ICD-10-CM

## 2015-04-05 DIAGNOSIS — R7989 Other specified abnormal findings of blood chemistry: Secondary | ICD-10-CM

## 2015-04-05 LAB — BASIC METABOLIC PANEL
BUN: 11 mg/dL (ref 6–23)
CHLORIDE: 104 meq/L (ref 96–112)
CO2: 27 meq/L (ref 19–32)
CREATININE: 0.84 mg/dL (ref 0.40–1.50)
Calcium: 10.1 mg/dL (ref 8.4–10.5)
GFR: 99.41 mL/min (ref >60.00)
Glucose, Bld: 92 mg/dL (ref 70–99)
Potassium: 3.5 mEq/L (ref 3.5–5.1)
Sodium: 141 mEq/L (ref 135–145)

## 2015-04-05 LAB — URINALYSIS
BILIRUBIN URINE: NEGATIVE
HGB URINE DIPSTICK: NEGATIVE
Ketones, ur: NEGATIVE
LEUKOCYTES UA: NEGATIVE
NITRITE: NEGATIVE
Specific Gravity, Urine: 1.015 (ref 1.000–1.030)
Total Protein, Urine: NEGATIVE
UROBILINOGEN UA: 1 (ref 0.0–1.0)
Urine Glucose: NEGATIVE
pH: 6 (ref 5.0–8.0)

## 2015-04-05 LAB — LIPID PANEL
CHOLESTEROL: 181 mg/dL (ref 0–200)
HDL: 43 mg/dL (ref >39.00)
NONHDL: 138.18
TRIGLYCERIDES: 218 mg/dL — AB (ref 0.0–149.0)
Total CHOL/HDL Ratio: 4
VLDL: 43.6 mg/dL — ABNORMAL HIGH (ref 0.0–40.0)

## 2015-04-05 LAB — HEPATIC FUNCTION PANEL
ALT: 22 U/L (ref 0–53)
AST: 21 U/L (ref 0–37)
Albumin: 4.9 g/dL (ref 3.5–5.2)
Alkaline Phosphatase: 82 U/L (ref 39–117)
BILIRUBIN DIRECT: 0.1 mg/dL (ref 0.0–0.3)
BILIRUBIN TOTAL: 0.5 mg/dL (ref 0.2–1.2)
TOTAL PROTEIN: 7.4 g/dL (ref 6.0–8.3)

## 2015-04-05 LAB — CBC WITH DIFFERENTIAL/PLATELET
BASOS PCT: 0.7 % (ref 0.0–3.0)
Basophils Absolute: 0 10*3/uL (ref 0.0–0.1)
EOS ABS: 0 10*3/uL (ref 0.0–0.7)
Eosinophils Relative: 0.7 % (ref 0.0–5.0)
HEMATOCRIT: 41.5 % (ref 39.0–52.0)
Hemoglobin: 14.2 g/dL (ref 13.0–17.0)
Lymphocytes Relative: 25.4 % (ref 12.0–46.0)
Lymphs Abs: 1.6 10*3/uL (ref 0.7–4.0)
MCHC: 34.2 g/dL (ref 30.0–36.0)
MCV: 94.1 fl (ref 78.0–100.0)
MONO ABS: 0.6 10*3/uL (ref 0.1–1.0)
Monocytes Relative: 10 % (ref 3.0–12.0)
NEUTROS ABS: 4 10*3/uL (ref 1.4–7.7)
Neutrophils Relative %: 63.2 % (ref 43.0–77.0)
PLATELETS: 293 10*3/uL (ref 150.0–400.0)
RBC: 4.41 Mil/uL (ref 4.22–5.81)
RDW: 12.6 % (ref 11.5–15.5)
WBC: 6.4 10*3/uL (ref 4.0–10.5)

## 2015-04-05 LAB — VITAMIN B12: Vitamin B-12: 273 pg/mL (ref 211–911)

## 2015-04-05 LAB — PSA: PSA: 0.72 ng/mL (ref 0.10–4.00)

## 2015-04-05 LAB — TSH: TSH: 1.33 u[IU]/mL (ref 0.35–4.50)

## 2015-04-05 LAB — LDL CHOLESTEROL, DIRECT: LDL DIRECT: 111 mg/dL

## 2015-04-05 MED ORDER — ZOLPIDEM TARTRATE 10 MG PO TABS: 5.0000 mg | ORAL_TABLET | Freq: Every evening | ORAL | Status: DC | PRN

## 2015-04-05 MED ORDER — TRAZODONE HCL 150 MG PO TABS: 75.0000 mg | ORAL_TABLET | Freq: Every day | ORAL | Status: DC

## 2015-04-05 NOTE — Progress Notes (Signed)
Subjective:  Patient ID: Austin Aguilar, male    DOB: January 23, 1956  Age: 60 y.o. MRN: [id number]  CC: Annual Exam   HPI Austin Aguilar [facility] presents for a well exam. C/o insomnia, arthralgias. C/o internal itch on B feet at times  Outpatient Prescriptions Prior to Visit  Medication Sig Dispense Refill  . amLODipine (NORVASC) 5 MG tablet TAKE 1 TABLET BY MOUTH EVERY DAY 90 tablet 1  . Ascorbic Acid (VITAMIN C) 1000 MG tablet Take 1,000 mg by mouth daily.     Marland Kitchen aspirin 81 MG EC tablet Take 81 mg by mouth daily.     Marland Kitchen losartan (COZAAR) 100 MG tablet Take 1 tablet (100 mg total) by mouth daily. 90 tablet 3  . Omega-3 Fatty Acids (FISH OIL) 1200 MG CAPS Take 1 capsule by mouth daily.    . traZODone (DESYREL) 50 MG tablet Take 1 tablet (50 mg total) by mouth at bedtime. 90 tablet 2  . sildenafil (VIAGRA) 100 MG tablet Take 1 tablet (100 mg total) by mouth as needed for erectile dysfunction. (Patient not taking: Reported on 04/05/2015) 12 tablet 11   No facility-administered medications prior to visit.    ROS Review of Systems  Constitutional: Negative for appetite change, fatigue and unexpected weight change.  HENT: Negative for congestion, nosebleeds, sneezing, sore throat and trouble swallowing.   Eyes: Negative for itching and visual disturbance.  Respiratory: Negative for cough.   Cardiovascular: Negative for chest pain, palpitations and leg swelling.  Gastrointestinal: Negative for nausea, diarrhea, blood in stool and abdominal distention.  Genitourinary: Negative for frequency and hematuria.  Musculoskeletal: Negative for back pain, joint swelling, gait problem and neck pain.  Skin: Negative for rash.  Neurological: Negative for dizziness, tremors, speech difficulty and weakness.  Psychiatric/Behavioral: Positive for sleep disturbance. Negative for suicidal ideas, dysphoric mood and agitation. The patient is not nervous/anxious.     Objective:  BP 150/90 mmHg  Pulse 90  Ht 5\' 8"   (1.727 m)  Wt 193 lb (87.544 kg)  BMI 29.35 kg/m2  SpO2 96%  BP Readings from Last 3 Encounters:  04/05/15 150/90  03/23/14 158/80  03/22/13 142/98    Wt Readings from Last 3 Encounters:  04/05/15 193 lb (87.544 kg)  03/23/14 188 lb (85.276 kg)  03/22/13 192 lb (87.091 kg)    Physical Exam  Constitutional: He is oriented to person, place, and time. He appears well-developed. No distress.  NAD  HENT:  Mouth/Throat: Oropharynx is clear and moist.  Eyes: Conjunctivae are normal. Pupils are equal, round, and reactive to light.  Neck: Normal range of motion. No JVD present. No thyromegaly present.  Cardiovascular: Normal rate, regular rhythm, normal heart sounds and intact distal pulses.  Exam reveals no gallop and no friction rub.   No murmur heard. Pulmonary/Chest: Effort normal and breath sounds normal. No respiratory distress. He has no wheezes. He has no rales. He exhibits no tenderness.  Abdominal: Soft. Bowel sounds are normal. He exhibits no distension and no mass. There is no tenderness. There is no rebound and no guarding.  Genitourinary: Rectum normal. Guaiac negative stool.  Musculoskeletal: Normal range of motion. He exhibits no edema or tenderness.  Lymphadenopathy:    He has no cervical adenopathy.  Neurological: He is alert and oriented to person, place, and time. He has normal reflexes. No cranial nerve deficit. He exhibits normal muscle tone. He displays a negative Romberg sign. Coordination and gait normal.  Skin: Skin is warm and dry. No  rash noted.  Psychiatric: He has a normal mood and affect. His behavior is normal. Judgment and thought content normal.  prostate 1+ NT  Lab Results  Component Value Date   WBC 6.8 03/23/2014   HGB 14.9 03/23/2014   HCT 44.1 03/23/2014   PLT 296.0 03/23/2014   GLUCOSE 102* 03/23/2014   CHOL 219* 03/23/2014   TRIG 144.0 03/23/2014   HDL 47.70 03/23/2014   LDLCALC 143* 03/23/2014   ALT 26 03/23/2014   AST 27 03/23/2014    NA 137 03/23/2014   K 4.0 03/23/2014   CL 103 03/23/2014   CREATININE 0.7 03/23/2014   BUN 11 03/23/2014   CO2 28 03/23/2014   TSH 1.48 03/23/2014   PSA 0.97 03/23/2014    No results found.  Assessment & Plan:   There are no diagnoses linked to this encounter. I am having Austin Aguilar maintain his aspirin, vitamin C, Fish Oil, traZODone, sildenafil, losartan, amLODipine, and Cholecalciferol.  Meds ordered this encounter  Medications  . Cholecalciferol 1000 units tablet    Sig: Take 1,000 Units by mouth daily.     Follow-up: No Follow-up on file.  Walker Kehr, MD

## 2015-04-05 NOTE — Assessment & Plan Note (Signed)
Chronic  Trazodone at hs Zolpidem prn  Potential benefits of a long term benzodiazepines  use as well as potential risks  and complications were explained to the patient and were aknowledged.

## 2015-04-05 NOTE — Progress Notes (Signed)
Pre visit review using our clinic review tool, if applicable. No additional management support is needed unless otherwise documented below in the visit note. 

## 2015-04-05 NOTE — Assessment & Plan Note (Signed)
Losartan 

## 2015-04-05 NOTE — Assessment & Plan Note (Signed)
We discussed age appropriate health related issues, including available/recomended screening tests and vaccinations. We discussed a need for adhering to healthy diet and exercise. Labs/EKG were reviewed/ordered. All questions were answered.   

## 2015-04-06 LAB — HEPATITIS C ANTIBODY: HCV Ab: NEGATIVE

## 2015-04-09 ENCOUNTER — Other Ambulatory Visit: Payer: Self-pay | Admitting: Internal Medicine

## 2015-06-28 ENCOUNTER — Other Ambulatory Visit: Payer: Self-pay | Admitting: Internal Medicine

## 2015-08-25 ENCOUNTER — Encounter: Payer: Self-pay | Admitting: Internal Medicine

## 2015-08-25 ENCOUNTER — Ambulatory Visit (INDEPENDENT_AMBULATORY_CARE_PROVIDER_SITE_OTHER): Payer: 59 | Admitting: Internal Medicine

## 2015-08-25 VITALS — BP 118/72 | HR 88 | Temp 97.5°F | Wt 198.0 lb

## 2015-08-25 DIAGNOSIS — F4321 Adjustment disorder with depressed mood: Secondary | ICD-10-CM | POA: Diagnosis not present

## 2015-08-25 DIAGNOSIS — G4700 Insomnia, unspecified: Secondary | ICD-10-CM | POA: Diagnosis not present

## 2015-08-25 DIAGNOSIS — F411 Generalized anxiety disorder: Secondary | ICD-10-CM

## 2015-08-25 HISTORY — DX: Adjustment disorder with depressed mood: F43.21

## 2015-08-25 NOTE — Progress Notes (Signed)
Subjective:  Patient ID: Austin Aguilar, male    DOB: [birth year]  Age: 60 y.o. MRN: [id number]  CC: No chief complaint on file.   HPI AUGIE MCMANAMA presents for grief - wife died on 08/23/15. F/u HTN, insomnia,   Outpatient Prescriptions Prior to Visit  Medication Sig Dispense Refill  . amLODipine (NORVASC) 5 MG tablet TAKE 1 TABLET BY MOUTH EVERY DAY 90 tablet 3  . Ascorbic Acid (VITAMIN C) 1000 MG tablet Take 1,000 mg by mouth daily.     Marland Kitchen aspirin 81 MG EC tablet Take 81 mg by mouth daily.     . Cholecalciferol 1000 units tablet Take 1,000 Units by mouth daily.    Marland Kitchen losartan (COZAAR) 100 MG tablet TAKE 1 TABLET BY MOUTH EVERY DAY 90 tablet 3  . Omega-3 Fatty Acids (FISH OIL) 1200 MG CAPS Take 1 capsule by mouth daily.    . traZODone (DESYREL) 150 MG tablet Take 0.5-1 tablets (75-150 mg total) by mouth at bedtime. 90 tablet 3  . zolpidem (AMBIEN) 10 MG tablet Take 0.5-1 tablets (5-10 mg total) by mouth at bedtime as needed for sleep. 90 tablet 0  . sildenafil (VIAGRA) 100 MG tablet Take 1 tablet (100 mg total) by mouth as needed for erectile dysfunction. (Patient not taking: Reported on 04/05/2015) 12 tablet 11   No facility-administered medications prior to visit.    ROS Review of Systems  Constitutional: Negative for appetite change, fatigue and unexpected weight change.  HENT: Negative for congestion, nosebleeds, sneezing, sore throat and trouble swallowing.   Eyes: Negative for itching and visual disturbance.  Respiratory: Negative for cough.   Cardiovascular: Negative for chest pain, palpitations and leg swelling.  Gastrointestinal: Negative for nausea, diarrhea, blood in stool and abdominal distention.  Genitourinary: Negative for frequency and hematuria.  Musculoskeletal: Negative for back pain, joint swelling, gait problem and neck pain.  Skin: Negative for rash.  Neurological: Negative for dizziness, tremors, speech difficulty and weakness.    Psychiatric/Behavioral: Positive for sleep disturbance and decreased concentration. Negative for suicidal ideas, dysphoric mood and agitation. The patient is nervous/anxious.     Objective:  BP 118/72 mmHg  Pulse 88  Temp(Src) 97.5 F (36.4 C)  Wt 198 lb (89.812 kg)  SpO2 97%  BP Readings from Last 3 Encounters:  08/25/15 118/72  04/05/15 150/90  03/23/14 158/80    Wt Readings from Last 3 Encounters:  08/25/15 198 lb (89.812 kg)  04/05/15 193 lb (87.544 kg)  03/23/14 188 lb (85.276 kg)    Physical Exam  Constitutional: He is oriented to person, place, and time. He appears well-developed. No distress.  NAD  HENT:  Mouth/Throat: Oropharynx is clear and moist.  Eyes: Conjunctivae are normal. Pupils are equal, round, and reactive to light.  Neck: Normal range of motion. No JVD present. No thyromegaly present.  Cardiovascular: Normal rate, regular rhythm, normal heart sounds and intact distal pulses.  Exam reveals no gallop and no friction rub.   No murmur heard. Pulmonary/Chest: Effort normal and breath sounds normal. No respiratory distress. He has no wheezes. He has no rales. He exhibits no tenderness.  Abdominal: Soft. Bowel sounds are normal. He exhibits no distension and no mass. There is no tenderness. There is no rebound and no guarding.  Musculoskeletal: Normal range of motion. He exhibits no edema or tenderness.  Lymphadenopathy:    He has no cervical adenopathy.  Neurological: He is alert and oriented to person, place, and time. He has  normal reflexes. No cranial nerve deficit. He exhibits normal muscle tone. He displays a negative Romberg sign. Coordination and gait normal.  Skin: Skin is warm and dry. No rash noted.  Psychiatric: His behavior is normal. Judgment and thought content normal.   Sad    Lab Results  Component Value Date   WBC 6.4 04/05/2015   HGB 14.2 04/05/2015   HCT 41.5 04/05/2015   PLT 293.0 04/05/2015   GLUCOSE 92 04/05/2015   CHOL 181  04/05/2015   TRIG 218.0* 04/05/2015   HDL 43.00 04/05/2015   LDLDIRECT 111.0 04/05/2015   LDLCALC 143* 03/23/2014   ALT 22 04/05/2015   AST 21 04/05/2015   NA 141 04/05/2015   K 3.5 04/05/2015   CL 104 04/05/2015   CREATININE 0.84 04/05/2015   BUN 11 04/05/2015   CO2 27 04/05/2015   TSH 1.33 04/05/2015   PSA 0.72 04/05/2015    No results found.  Assessment & Plan:   There are no diagnoses linked to this encounter. I am having Mr. Gilleland maintain his aspirin, vitamin C, Fish Oil, sildenafil, Cholecalciferol, traZODone, zolpidem, amLODipine, and losartan.  No orders of the defined types were placed in this encounter.     Follow-up: No Follow-up on file.  Walker Kehr, MD

## 2015-08-25 NOTE — Assessment & Plan Note (Signed)
wife died on May 26th 2017 - lung ca Discussed  Note off work  Counseling suggested

## 2015-08-25 NOTE — Assessment & Plan Note (Addendum)
Worse: wife died on May 26th 2017 - lung ca Trazodone, Zolpidem for insomnia  Potential benefits of a long term benzodiazepines  use as well as potential risks  and complications were explained to the patient and were aknowledged.

## 2015-08-25 NOTE — Assessment & Plan Note (Addendum)
  Worse: wife died on May 26th 2017 - lung ca Trazodone, Zolpidem for insomnia

## 2015-08-25 NOTE — Progress Notes (Signed)
Pre visit review using our clinic review tool, if applicable. No additional management support is needed unless otherwise documented below in the visit note. 

## 2015-09-04 ENCOUNTER — Encounter: Payer: Self-pay | Admitting: Internal Medicine

## 2015-09-28 ENCOUNTER — Ambulatory Visit (INDEPENDENT_AMBULATORY_CARE_PROVIDER_SITE_OTHER): Payer: 59 | Admitting: Internal Medicine

## 2015-09-28 ENCOUNTER — Encounter: Payer: Self-pay | Admitting: Internal Medicine

## 2015-09-28 VITALS — BP 150/90 | HR 85 | Wt 199.0 lb

## 2015-09-28 DIAGNOSIS — M5387 Other specified dorsopathies, lumbosacral region: Secondary | ICD-10-CM | POA: Insufficient documentation

## 2015-09-28 DIAGNOSIS — I10 Essential (primary) hypertension: Secondary | ICD-10-CM | POA: Diagnosis not present

## 2015-09-28 DIAGNOSIS — G4700 Insomnia, unspecified: Secondary | ICD-10-CM

## 2015-09-28 DIAGNOSIS — F4321 Adjustment disorder with depressed mood: Secondary | ICD-10-CM | POA: Diagnosis not present

## 2015-09-28 DIAGNOSIS — M5431 Sciatica, right side: Secondary | ICD-10-CM | POA: Diagnosis not present

## 2015-09-28 MED ORDER — PREDNISONE 10 MG PO TABS: ORAL_TABLET | ORAL | Status: DC

## 2015-09-28 MED ORDER — TRAMADOL HCL 50 MG PO TABS: 50.0000 mg | ORAL_TABLET | Freq: Two times a day (BID) | ORAL | Status: DC | PRN

## 2015-09-28 NOTE — Assessment & Plan Note (Signed)
Zolpidem low dose prn --- 1/4-1/2 tab

## 2015-09-28 NOTE — Progress Notes (Signed)
Pre visit review using our clinic review tool, if applicable. No additional management support is needed unless otherwise documented below in the visit note. 

## 2015-09-28 NOTE — Assessment & Plan Note (Signed)
Losartan Check BP at home 

## 2015-09-28 NOTE — Assessment & Plan Note (Addendum)
R side Prednisone 10 mg: take 4 tabs a day x 3 days; then 3 tabs a day x 4 days; then 2 tabs a day x 4 days, then 1 tab a day x 6 days, then stop. Take pc. Stretch X rays if needed Tramadol prn  Potential benefits of tramadol and steroid  use as well as potential risks  and complications were explained to the patient and were aknowledged.

## 2015-09-28 NOTE — Assessment & Plan Note (Signed)
Discussed Better  

## 2015-09-28 NOTE — Patient Instructions (Signed)

## 2015-09-28 NOTE — Progress Notes (Signed)
Subjective:  Patient ID: Austin Aguilar, male    DOB: 1955-05-12  Age: 60 y.o. MRN: [id number]  CC: No chief complaint on file.   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 There was another death in the family C/o pain in the R LS irrad down to R foot. It is better, still having a numbness in the R lat shin; some weakness in the   Outpatient Prescriptions Prior to Visit  Medication Sig Dispense Refill  . amLODipine (NORVASC) 5 MG tablet TAKE 1 TABLET BY MOUTH EVERY DAY 90 tablet 3  . Ascorbic Acid (VITAMIN C) 1000 MG tablet Take 1,000 mg by mouth daily.     Marland Kitchen aspirin 81 MG EC tablet Take 81 mg by mouth daily.     . Cholecalciferol 1000 units tablet Take 1,000 Units by mouth daily.    Marland Kitchen losartan (COZAAR) 100 MG tablet TAKE 1 TABLET BY MOUTH EVERY DAY 90 tablet 3  . Omega-3 Fatty Acids (FISH OIL) 1200 MG CAPS Take 1 capsule by mouth daily.    . sildenafil (VIAGRA) 100 MG tablet Take 1 tablet (100 mg total) by mouth as needed for erectile dysfunction. 12 tablet 11  . traZODone (DESYREL) 150 MG tablet Take 0.5-1 tablets (75-150 mg total) by mouth at bedtime. 90 tablet 3  . zolpidem (AMBIEN) 10 MG tablet Take 0.5-1 tablets (5-10 mg total) by mouth at bedtime as needed for sleep. 90 tablet 0   No facility-administered medications prior to visit.    ROS Review of Systems  Constitutional: Negative for appetite change, fatigue and unexpected weight change.  HENT: Negative for congestion, nosebleeds, sneezing, sore throat and trouble swallowing.   Eyes: Negative for itching and visual disturbance.  Respiratory: Negative for cough.   Cardiovascular: Negative for chest pain, palpitations and leg swelling.  Gastrointestinal: Negative for nausea, diarrhea, blood in stool and abdominal distention.  Genitourinary: Negative for frequency and hematuria.  Musculoskeletal: Negative for back pain, joint swelling, gait problem and neck pain.  Skin: Negative for rash.  Neurological:  Negative for dizziness, tremors, speech difficulty and weakness.  Psychiatric/Behavioral: Positive for sleep disturbance. Negative for suicidal ideas, dysphoric mood and agitation. The patient is nervous/anxious.     Objective:  BP 150/90 mmHg  Pulse 85  Wt 199 lb (90.266 kg)  SpO2 97%  BP Readings from Last 3 Encounters:  09/28/15 150/90  08/25/15 118/72  04/05/15 150/90    Wt Readings from Last 3 Encounters:  09/28/15 199 lb (90.266 kg)  08/25/15 198 lb (89.812 kg)  04/05/15 193 lb (87.544 kg)    Physical Exam  Constitutional: He is oriented to person, place, and time. He appears well-developed. No distress.  NAD  HENT:  Mouth/Throat: Oropharynx is clear and moist.  Eyes: Conjunctivae are normal. Pupils are equal, round, and reactive to light.  Neck: Normal range of motion. No JVD present. No thyromegaly present.  Cardiovascular: Normal rate, regular rhythm, normal heart sounds and intact distal pulses.  Exam reveals no gallop and no friction rub.   No murmur heard. Pulmonary/Chest: Effort normal and breath sounds normal. No respiratory distress. He has no wheezes. He has no rales. He exhibits no tenderness.  Abdominal: Soft. Bowel sounds are normal. He exhibits no distension and no mass. There is no tenderness. There is no rebound and no guarding.  Musculoskeletal: Normal range of motion. He exhibits no edema or tenderness.  Lymphadenopathy:    He has no cervical adenopathy.  Neurological: He  is alert and oriented to person, place, and time. He has normal reflexes. No cranial nerve deficit. He exhibits normal muscle tone. He displays a negative Romberg sign. Coordination and gait normal.  Skin: Skin is warm and dry. No rash noted.  Psychiatric: He has a normal mood and affect. His behavior is normal. Judgment and thought content normal.    Lab Results  Component Value Date   WBC 6.4 04/05/2015   HGB 14.2 04/05/2015   HCT 41.5 04/05/2015   PLT 293.0 04/05/2015    GLUCOSE 92 04/05/2015   CHOL 181 04/05/2015   TRIG 218.0* 04/05/2015   HDL 43.00 04/05/2015   LDLDIRECT 111.0 04/05/2015   LDLCALC 143* 03/23/2014   ALT 22 04/05/2015   AST 21 04/05/2015   NA 141 04/05/2015   K 3.5 04/05/2015   CL 104 04/05/2015   CREATININE 0.84 04/05/2015   BUN 11 04/05/2015   CO2 27 04/05/2015   TSH 1.33 04/05/2015   PSA 0.72 04/05/2015    No results found.  Assessment & Plan:   There are no diagnoses linked to this encounter. I am having Mr. Lupe maintain his aspirin, vitamin C, Fish Oil, sildenafil, Cholecalciferol, traZODone, zolpidem, amLODipine, and losartan.  No orders of the defined types were placed in this encounter.     Follow-up: No Follow-up on file.  Walker Kehr, MD

## 2015-10-02 ENCOUNTER — Ambulatory Visit: Payer: 59 | Admitting: Internal Medicine

## 2015-11-09 ENCOUNTER — Ambulatory Visit (INDEPENDENT_AMBULATORY_CARE_PROVIDER_SITE_OTHER): Payer: 59 | Admitting: Internal Medicine

## 2015-11-09 ENCOUNTER — Encounter: Payer: Self-pay | Admitting: Internal Medicine

## 2015-11-09 DIAGNOSIS — M5431 Sciatica, right side: Secondary | ICD-10-CM

## 2015-11-09 DIAGNOSIS — F4321 Adjustment disorder with depressed mood: Secondary | ICD-10-CM

## 2015-11-09 MED ORDER — TRAMADOL HCL 50 MG PO TABS: 50.0000 mg | ORAL_TABLET | Freq: Two times a day (BID) | ORAL | 0 refills | Status: DC | PRN

## 2015-11-09 MED ORDER — IBUPROFEN 600 MG PO TABS: 600.0000 mg | ORAL_TABLET | Freq: Three times a day (TID) | ORAL | 1 refills | Status: DC | PRN

## 2015-11-09 NOTE — Assessment & Plan Note (Addendum)
70% better MRI if worse or not beter - discussed Ibuprofen Tramadol prn  Potential benefits of tramadol and steroid  use as well as potential risks  and complications were explained to the patient and were aknowledged.

## 2015-11-09 NOTE — Assessment & Plan Note (Signed)
Discussed.

## 2015-11-09 NOTE — Progress Notes (Signed)
Subjective:  Patient ID: Austin Aguilar, male    DOB: 08/14/1955  Age: 60 y.o. MRN: [id number]  CC: Follow-up (6 weeks)   HPI Austin Aguilar presents for R sciatica - 70% better after Prednisone x2 cycles. There is some giving way in the R leg sometimes. F/u depression, grief, HTN  Outpatient Medications Prior to Visit  Medication Sig Dispense Refill  . amLODipine (NORVASC) 5 MG tablet TAKE 1 TABLET BY MOUTH EVERY DAY 90 tablet 3  . Ascorbic Acid (VITAMIN C) 1000 MG tablet Take 1,000 mg by mouth daily.     Marland Kitchen aspirin 81 MG EC tablet Take 81 mg by mouth daily.     . Cholecalciferol 1000 units tablet Take 1,000 Units by mouth daily.    Marland Kitchen losartan (COZAAR) 100 MG tablet TAKE 1 TABLET BY MOUTH EVERY DAY 90 tablet 3  . Omega-3 Fatty Acids (FISH OIL) 1200 MG CAPS Take 1 capsule by mouth daily.    . traZODone (DESYREL) 150 MG tablet Take 0.5-1 tablets (75-150 mg total) by mouth at bedtime. 90 tablet 3  . zolpidem (AMBIEN) 10 MG tablet Take 0.5-1 tablets (5-10 mg total) by mouth at bedtime as needed for sleep. 90 tablet 0  . predniSONE (DELTASONE) 10 MG tablet Prednisone 10 mg: take 4 tabs a day x 3 days; then 3 tabs a day x 4 days; then 2 tabs a day x 4 days, then 1 tab a day x 6 days, then stop. Take pc. (Patient not taking: Reported on 11/09/2015) 38 tablet 1  . sildenafil (VIAGRA) 100 MG tablet Take 1 tablet (100 mg total) by mouth as needed for erectile dysfunction. (Patient not taking: Reported on 11/09/2015) 12 tablet 11  . traMADol (ULTRAM) 50 MG tablet Take 1-2 tablets (50-100 mg total) by mouth 2 (two) times daily as needed. (Patient not taking: Reported on 11/09/2015) 60 tablet 0   No facility-administered medications prior to visit.     ROS Review of Systems  Constitutional: Negative for appetite change, fatigue and unexpected weight change.  HENT: Negative for congestion, nosebleeds, sneezing, sore throat and trouble swallowing.   Eyes: Negative for itching and visual  disturbance.  Respiratory: Negative for cough.   Cardiovascular: Negative for chest pain, palpitations and leg swelling.  Gastrointestinal: Negative for abdominal distention, blood in stool, diarrhea and nausea.  Genitourinary: Negative for frequency and hematuria.  Musculoskeletal: Positive for back pain and gait problem. Negative for joint swelling and neck pain.  Skin: Negative for rash.  Neurological: Positive for weakness. Negative for dizziness, tremors and speech difficulty.  Psychiatric/Behavioral: Negative for agitation, dysphoric mood, sleep disturbance and suicidal ideas. The patient is not nervous/anxious.     Objective:  BP 128/60 (BP Location: Right Arm, Patient Position: Sitting, Cuff Size: Large)   Pulse (!) 103   Temp 98.2 F (36.8 C) (Oral)   Ht 5\' 8"  (1.727 m)   Wt 201 lb (91.2 kg)   SpO2 98%   BMI 30.56 kg/m   BP Readings from Last 3 Encounters:  11/09/15 128/60  09/28/15 (!) 150/90  08/25/15 118/72    Wt Readings from Last 3 Encounters:  11/09/15 201 lb (91.2 kg)  09/28/15 199 lb (90.3 kg)  08/25/15 198 lb (89.8 kg)    Physical Exam  Constitutional: He is oriented to person, place, and time. He appears well-developed. No distress.  NAD  HENT:  Mouth/Throat: Oropharynx is clear and moist.  Eyes: Conjunctivae are normal. Pupils are equal, round, and reactive  to light.  Neck: Normal range of motion. No JVD present. No thyromegaly present.  Cardiovascular: Normal rate, regular rhythm, normal heart sounds and intact distal pulses.  Exam reveals no gallop and no friction rub.   No murmur heard. Pulmonary/Chest: Effort normal and breath sounds normal. No respiratory distress. He has no wheezes. He has no rales. He exhibits no tenderness.  Abdominal: Soft. Bowel sounds are normal. He exhibits no distension and no mass. There is no tenderness. There is no rebound and no guarding.  Musculoskeletal: Normal range of motion. He exhibits no edema or tenderness.    Lymphadenopathy:    He has no cervical adenopathy.  Neurological: He is alert and oriented to person, place, and time. He has normal reflexes. No cranial nerve deficit. He exhibits normal muscle tone. He displays a negative Romberg sign. Coordination and gait normal.  Skin: Skin is warm and dry. No rash noted.  Psychiatric: He has a normal mood and affect. His behavior is normal. Judgment and thought content normal.  Str leg elev (-) B MS WNL  Lab Results  Component Value Date   WBC 6.4 04/05/2015   HGB 14.2 04/05/2015   HCT 41.5 04/05/2015   PLT 293.0 04/05/2015   GLUCOSE 92 04/05/2015   CHOL 181 04/05/2015   TRIG 218.0 (H) 04/05/2015   HDL 43.00 04/05/2015   LDLDIRECT 111.0 04/05/2015   LDLCALC 143 (H) 03/23/2014   ALT 22 04/05/2015   AST 21 04/05/2015   NA 141 04/05/2015   K 3.5 04/05/2015   CL 104 04/05/2015   CREATININE 0.84 04/05/2015   BUN 11 04/05/2015   CO2 27 04/05/2015   TSH 1.33 04/05/2015   PSA 0.72 04/05/2015    No results found.  Assessment & Plan:   There are no diagnoses linked to this encounter. I am having Mr. Deveau maintain his aspirin, vitamin C, Fish Oil, sildenafil, Cholecalciferol, traZODone, zolpidem, amLODipine, losartan, predniSONE, and traMADol.  No orders of the defined types were placed in this encounter.    Follow-up: No Follow-up on file.  Walker Kehr, MD

## 2015-11-09 NOTE — Progress Notes (Signed)
Pre visit review using our clinic review tool, if applicable. No additional management support is needed unless otherwise documented below in the visit note. 

## 2015-12-21 ENCOUNTER — Ambulatory Visit: Payer: 59 | Admitting: Internal Medicine

## 2015-12-22 ENCOUNTER — Ambulatory Visit: Payer: 59 | Admitting: Internal Medicine

## 2016-04-22 ENCOUNTER — Encounter: Payer: Self-pay | Admitting: Gastroenterology

## 2016-04-22 ENCOUNTER — Other Ambulatory Visit: Payer: Self-pay | Admitting: Internal Medicine

## 2016-07-15 ENCOUNTER — Other Ambulatory Visit: Payer: Self-pay | Admitting: Internal Medicine

## 2016-08-06 ENCOUNTER — Encounter: Payer: Self-pay | Admitting: Internal Medicine

## 2016-08-06 ENCOUNTER — Other Ambulatory Visit (INDEPENDENT_AMBULATORY_CARE_PROVIDER_SITE_OTHER): Payer: 59

## 2016-08-06 ENCOUNTER — Ambulatory Visit (INDEPENDENT_AMBULATORY_CARE_PROVIDER_SITE_OTHER): Payer: 59 | Admitting: Internal Medicine

## 2016-08-06 VITALS — BP 142/84 | HR 78 | Temp 98.0°F | Ht 68.0 in | Wt 199.0 lb

## 2016-08-06 DIAGNOSIS — Z0000 Encounter for general adult medical examination without abnormal findings: Secondary | ICD-10-CM | POA: Diagnosis not present

## 2016-08-06 DIAGNOSIS — F4320 Adjustment disorder, unspecified: Secondary | ICD-10-CM | POA: Diagnosis not present

## 2016-08-06 DIAGNOSIS — R7989 Other specified abnormal findings of blood chemistry: Secondary | ICD-10-CM | POA: Diagnosis not present

## 2016-08-06 DIAGNOSIS — G4700 Insomnia, unspecified: Secondary | ICD-10-CM | POA: Diagnosis not present

## 2016-08-06 DIAGNOSIS — F4321 Adjustment disorder with depressed mood: Secondary | ICD-10-CM

## 2016-08-06 LAB — HEPATIC FUNCTION PANEL
ALBUMIN: 4.8 g/dL (ref 3.5–5.2)
ALK PHOS: 71 U/L (ref 39–117)
ALT: 40 U/L (ref 0–53)
AST: 37 U/L (ref 0–37)
BILIRUBIN DIRECT: 0.2 mg/dL (ref 0.0–0.3)
TOTAL PROTEIN: 7 g/dL (ref 6.0–8.3)
Total Bilirubin: 0.8 mg/dL (ref 0.2–1.2)

## 2016-08-06 LAB — CBC WITH DIFFERENTIAL/PLATELET
BASOS ABS: 0.1 10*3/uL (ref 0.0–0.1)
BASOS PCT: 0.6 % (ref 0.0–3.0)
EOS ABS: 0.1 10*3/uL (ref 0.0–0.7)
Eosinophils Relative: 1.5 % (ref 0.0–5.0)
HEMATOCRIT: 40.9 % (ref 39.0–52.0)
Hemoglobin: 14.3 g/dL (ref 13.0–17.0)
LYMPHS ABS: 2.1 10*3/uL (ref 0.7–4.0)
Lymphocytes Relative: 24 % (ref 12.0–46.0)
MCHC: 35 g/dL (ref 30.0–36.0)
MCV: 94.2 fl (ref 78.0–100.0)
Monocytes Absolute: 0.6 10*3/uL (ref 0.1–1.0)
Monocytes Relative: 7.1 % (ref 3.0–12.0)
NEUTROS ABS: 5.7 10*3/uL (ref 1.4–7.7)
NEUTROS PCT: 66.8 % (ref 43.0–77.0)
PLATELETS: 244 10*3/uL (ref 150.0–400.0)
RBC: 4.34 Mil/uL (ref 4.22–5.81)
RDW: 12.6 % (ref 11.5–15.5)
WBC: 8.6 10*3/uL (ref 4.0–10.5)

## 2016-08-06 LAB — BASIC METABOLIC PANEL
BUN: 14 mg/dL (ref 6–23)
CALCIUM: 9.5 mg/dL (ref 8.4–10.5)
CHLORIDE: 105 meq/L (ref 96–112)
CO2: 26 meq/L (ref 19–32)
CREATININE: 0.87 mg/dL (ref 0.40–1.50)
GFR: 95.03 mL/min (ref >60.00)
GLUCOSE: 109 mg/dL — AB (ref 70–99)
Potassium: 3.3 mEq/L — ABNORMAL LOW (ref 3.5–5.1)
Sodium: 143 mEq/L (ref 135–145)

## 2016-08-06 LAB — URINALYSIS
Bilirubin Urine: NEGATIVE
Hgb urine dipstick: NEGATIVE
Ketones, ur: NEGATIVE
LEUKOCYTES UA: NEGATIVE
Nitrite: NEGATIVE
PH: 6 (ref 5.0–8.0)
SPECIFIC GRAVITY, URINE: 1.015 (ref 1.000–1.030)
Total Protein, Urine: NEGATIVE
UROBILINOGEN UA: 0.2 (ref 0.0–1.0)
Urine Glucose: NEGATIVE

## 2016-08-06 LAB — LIPID PANEL
Cholesterol: 192 mg/dL (ref 0–200)
HDL: 42.3 mg/dL (ref >39.00)
NONHDL: 149.77
TRIGLYCERIDES: 334 mg/dL — AB (ref 0.0–149.0)
Total CHOL/HDL Ratio: 5
VLDL: 66.8 mg/dL — AB (ref 0.0–40.0)

## 2016-08-06 LAB — PSA: PSA: 0.75 ng/mL (ref 0.10–4.00)

## 2016-08-06 LAB — LDL CHOLESTEROL, DIRECT: LDL DIRECT: 114 mg/dL

## 2016-08-06 LAB — TSH: TSH: 3.1 u[IU]/mL (ref 0.35–4.50)

## 2016-08-06 MED ORDER — IBUPROFEN 600 MG PO TABS: 600.0000 mg | ORAL_TABLET | Freq: Three times a day (TID) | ORAL | 1 refills | Status: DC | PRN

## 2016-08-06 NOTE — Patient Instructions (Signed)
Shingrix

## 2016-08-06 NOTE — Assessment & Plan Note (Signed)
Discussed: wife died on May 26th 2017 - lung ca

## 2016-08-06 NOTE — Progress Notes (Signed)
Subjective:  Patient ID: Austin Aguilar, male    DOB: 08-19-1955  Age: 61 y.o. MRN: [id number]  CC: No chief complaint on file.   HPI Austin Aguilar presents for a well exam and for HTN, anxiety, depression f/u  Outpatient Medications Prior to Visit  Medication Sig Dispense Refill  . amLODipine (NORVASC) 5 MG tablet TAKE 1 TABLET BY MOUTH EVERY DAY 90 tablet 1  . Ascorbic Acid (VITAMIN C) 1000 MG tablet Take 1,000 mg by mouth daily.     Marland Kitchen aspirin 81 MG EC tablet Take 81 mg by mouth daily.     . Cholecalciferol 1000 units tablet Take 1,000 Units by mouth daily.    Marland Kitchen ibuprofen (ADVIL,MOTRIN) 600 MG tablet Take 1 tablet (600 mg total) by mouth every 8 (eight) hours as needed for moderate pain. 60 tablet 1  . losartan (COZAAR) 100 MG tablet Take 1 tablet (100 mg total) by mouth daily. Overdue for yearly physical w/labs must see MD for refills 30 tablet 0  . Omega-3 Fatty Acids (FISH OIL) 1200 MG CAPS Take 1 capsule by mouth daily.    . traMADol (ULTRAM) 50 MG tablet Take 1-2 tablets (50-100 mg total) by mouth 2 (two) times daily as needed. 60 tablet 0  . traZODone (DESYREL) 150 MG tablet Take 0.5-1 tablets (75-150 mg total) by mouth at bedtime. 90 tablet 3  . zolpidem (AMBIEN) 10 MG tablet Take 0.5-1 tablets (5-10 mg total) by mouth at bedtime as needed for sleep. 90 tablet 0  . sildenafil (VIAGRA) 100 MG tablet Take 1 tablet (100 mg total) by mouth as needed for erectile dysfunction. (Patient not taking: Reported on 11/09/2015) 12 tablet 11   No facility-administered medications prior to visit.     ROS Review of Systems  Constitutional: Negative for appetite change, fatigue and unexpected weight change.  HENT: Negative for congestion, nosebleeds, sneezing, sore throat and trouble swallowing.   Eyes: Negative for itching and visual disturbance.  Respiratory: Negative for cough.   Cardiovascular: Negative for chest pain, palpitations and leg swelling.  Gastrointestinal: Negative for  abdominal distention, blood in stool, diarrhea and nausea.  Genitourinary: Negative for frequency and hematuria.  Musculoskeletal: Negative for back pain, gait problem, joint swelling and neck pain.  Skin: Negative for rash.  Neurological: Negative for dizziness, tremors, speech difficulty and weakness.  Psychiatric/Behavioral: Negative for agitation, dysphoric mood and sleep disturbance. The patient is not nervous/anxious.     Objective:  BP (!) 142/84 (BP Location: Left Arm, Patient Position: Sitting, Cuff Size: Normal)   Pulse 78   Temp 98 F (36.7 C) (Oral)   Ht 5\' 8"  (1.727 m)   Wt 199 lb (90.3 kg)   SpO2 99%   BMI 30.26 kg/m   BP Readings from Last 3 Encounters:  08/06/16 (!) 142/84  11/09/15 128/60  09/28/15 (!) 150/90    Wt Readings from Last 3 Encounters:  08/06/16 199 lb (90.3 kg)  11/09/15 201 lb (91.2 kg)  09/28/15 199 lb (90.3 kg)    Physical Exam  Constitutional: He is oriented to person, place, and time. He appears well-developed. No distress.  NAD  HENT:  Mouth/Throat: Oropharynx is clear and moist.  Eyes: Conjunctivae are normal. Pupils are equal, round, and reactive to light.  Neck: Normal range of motion. No JVD present. No thyromegaly present.  Cardiovascular: Normal rate, regular rhythm, normal heart sounds and intact distal pulses.  Exam reveals no gallop and no friction rub.   No murmur  heard. Pulmonary/Chest: Effort normal and breath sounds normal. No respiratory distress. He has no wheezes. He has no rales. He exhibits no tenderness.  Abdominal: Soft. Bowel sounds are normal. He exhibits no distension and no mass. There is no tenderness. There is no rebound and no guarding.  Genitourinary: Rectum normal and prostate normal. Rectal exam shows guaiac negative stool.  Musculoskeletal: Normal range of motion. He exhibits no edema or tenderness.  Lymphadenopathy:    He has no cervical adenopathy.  Neurological: He is alert and oriented to person,  place, and time. He has normal reflexes. No cranial nerve deficit. He exhibits normal muscle tone. He displays a negative Romberg sign. Coordination and gait normal.  Skin: Skin is warm and dry. No rash noted.  Psychiatric: He has a normal mood and affect. His behavior is normal. Judgment and thought content normal.    Lab Results  Component Value Date   WBC 6.4 04/05/2015   HGB 14.2 04/05/2015   HCT 41.5 04/05/2015   PLT 293.0 04/05/2015   GLUCOSE 92 04/05/2015   CHOL 181 04/05/2015   TRIG 218.0 (H) 04/05/2015   HDL 43.00 04/05/2015   LDLDIRECT 111.0 04/05/2015   LDLCALC 143 (H) 03/23/2014   ALT 22 04/05/2015   AST 21 04/05/2015   NA 141 04/05/2015   K 3.5 04/05/2015   CL 104 04/05/2015   CREATININE 0.84 04/05/2015   BUN 11 04/05/2015   CO2 27 04/05/2015   TSH 1.33 04/05/2015   PSA 0.72 04/05/2015    No results found.  Assessment & Plan:   There are no diagnoses linked to this encounter. I have discontinued Mr. Matlack sildenafil. I am also having him maintain his aspirin, vitamin C, Fish Oil, Cholecalciferol, traZODone, zolpidem, traMADol, ibuprofen, amLODipine, and losartan.  No orders of the defined types were placed in this encounter.    Follow-up: No Follow-up on file.  Walker Kehr, MD

## 2016-08-06 NOTE — Assessment & Plan Note (Signed)
Zolpidem prn  Potential benefits of a long term benzodiazepines  use as well as potential risks  and complications were explained to the patient and were aknowledged. 

## 2016-08-06 NOTE — Assessment & Plan Note (Addendum)
We discussed age appropriate health related issues, including available/recomended screening tests and vaccinations. We discussed a need for adhering to healthy diet and exercise. Labs were ordered to be later reviewed . All questions were answered. Cologuard vs colonoscopy discussed Shingrix discussed

## 2016-08-12 ENCOUNTER — Other Ambulatory Visit: Payer: Self-pay | Admitting: Internal Medicine

## 2016-08-12 MED ORDER — POTASSIUM CHLORIDE CRYS ER 20 MEQ PO TBCR: 20.0000 meq | EXTENDED_RELEASE_TABLET | Freq: Every day | ORAL | 0 refills | Status: DC

## 2016-08-26 ENCOUNTER — Other Ambulatory Visit: Payer: Self-pay | Admitting: *Deleted

## 2016-08-26 MED ORDER — LOSARTAN POTASSIUM 100 MG PO TABS: 100.0000 mg | ORAL_TABLET | Freq: Every day | ORAL | 3 refills | Status: DC

## 2016-09-22 ENCOUNTER — Other Ambulatory Visit: Payer: Self-pay | Admitting: Internal Medicine

## 2016-09-23 NOTE — Telephone Encounter (Signed)
Routing to dr plotnikov, please advise, thanks 

## 2016-10-30 ENCOUNTER — Other Ambulatory Visit: Payer: Self-pay | Admitting: Internal Medicine

## 2016-11-08 ENCOUNTER — Other Ambulatory Visit: Payer: Self-pay | Admitting: Internal Medicine

## 2017-02-10 ENCOUNTER — Ambulatory Visit (INDEPENDENT_AMBULATORY_CARE_PROVIDER_SITE_OTHER): Payer: 59 | Admitting: Internal Medicine

## 2017-02-10 ENCOUNTER — Encounter: Payer: Self-pay | Admitting: Internal Medicine

## 2017-02-10 ENCOUNTER — Other Ambulatory Visit (INDEPENDENT_AMBULATORY_CARE_PROVIDER_SITE_OTHER): Payer: 59

## 2017-02-10 VITALS — BP 148/84 | HR 77 | Temp 98.4°F | Ht 68.0 in | Wt 193.0 lb

## 2017-02-10 DIAGNOSIS — R739 Hyperglycemia, unspecified: Secondary | ICD-10-CM | POA: Diagnosis not present

## 2017-02-10 DIAGNOSIS — I10 Essential (primary) hypertension: Secondary | ICD-10-CM

## 2017-02-10 DIAGNOSIS — Z23 Encounter for immunization: Secondary | ICD-10-CM

## 2017-02-10 DIAGNOSIS — E876 Hypokalemia: Secondary | ICD-10-CM | POA: Insufficient documentation

## 2017-02-10 DIAGNOSIS — F411 Generalized anxiety disorder: Secondary | ICD-10-CM

## 2017-02-10 LAB — HEMOGLOBIN A1C: HEMOGLOBIN A1C: 5.1 % (ref 4.6–6.5)

## 2017-02-10 LAB — BASIC METABOLIC PANEL
BUN: 13 mg/dL (ref 6–23)
CALCIUM: 10.3 mg/dL (ref 8.4–10.5)
CO2: 26 meq/L (ref 19–32)
CREATININE: 0.74 mg/dL (ref 0.40–1.50)
Chloride: 100 mEq/L (ref 96–112)
GFR: 114.35 mL/min (ref >60.00)
Glucose, Bld: 84 mg/dL (ref 70–99)
Potassium: 3 mEq/L — ABNORMAL LOW (ref 3.5–5.1)
Sodium: 143 mEq/L (ref 135–145)

## 2017-02-10 LAB — TSH: TSH: 2.6 u[IU]/mL (ref 0.35–4.50)

## 2017-02-10 NOTE — Progress Notes (Signed)
Subjective:  Patient ID: Austin Aguilar, male    DOB: 09/18/1955  Age: 61 y.o. MRN: [id number]  CC: No chief complaint on file.   HPI Saulo Anthis Bouton presents for HTN, low K, depression. He had a rash from KCl - after 1 month. He stopped KCl and rash has disappeared... Lost wt on diet  Outpatient Medications Prior to Visit  Medication Sig Dispense Refill  . amLODipine (NORVASC) 5 MG tablet TAKE 1 TABLET BY MOUTH EVERY DAY 90 tablet 1  . Ascorbic Acid (VITAMIN C) 1000 MG tablet Take 1,000 mg by mouth daily.     Marland Kitchen aspirin 81 MG EC tablet Take 81 mg by mouth daily.     . Cholecalciferol 1000 units tablet Take 1,000 Units by mouth daily.    Marland Kitchen ibuprofen (ADVIL,MOTRIN) 600 MG tablet Take 1 tablet (600 mg total) by mouth every 8 (eight) hours as needed for moderate pain. 60 tablet 1  . losartan (COZAAR) 100 MG tablet Take 1 tablet (100 mg total) by mouth daily. 90 tablet 3  . Omega-3 Fatty Acids (FISH OIL) 1200 MG CAPS Take 1 capsule by mouth daily.    . traMADol (ULTRAM) 50 MG tablet Take 1-2 tablets (50-100 mg total) by mouth 2 (two) times daily as needed. 60 tablet 0  . traZODone (DESYREL) 150 MG tablet Take 0.5-1 tablets (75-150 mg total) by mouth at bedtime. 90 tablet 3  . zolpidem (AMBIEN) 10 MG tablet Take 0.5-1 tablets (5-10 mg total) by mouth at bedtime as needed for sleep. 90 tablet 0  . KLOR-CON M20 20 MEQ tablet TAKE 1 TABLET BY MOUTH EVERY DAY 90 tablet 1   No facility-administered medications prior to visit.     ROS Review of Systems  Constitutional: Negative for appetite change, fatigue and unexpected weight change.  HENT: Negative for congestion, nosebleeds, sneezing, sore throat and trouble swallowing.   Eyes: Negative for itching and visual disturbance.  Respiratory: Negative for cough.   Cardiovascular: Negative for chest pain, palpitations and leg swelling.  Gastrointestinal: Negative for abdominal distention, blood in stool, diarrhea and nausea.  Genitourinary:  Negative for frequency and hematuria.  Musculoskeletal: Negative for back pain, gait problem, joint swelling and neck pain.  Skin: Negative for rash.  Neurological: Negative for dizziness, tremors, speech difficulty and weakness.  Psychiatric/Behavioral: Negative for agitation, dysphoric mood, sleep disturbance and suicidal ideas. The patient is nervous/anxious.     Objective:  BP (!) 148/84 (BP Location: Left Arm, Patient Position: Sitting, Cuff Size: Large)   Pulse 77   Temp 98.4 F (36.9 C) (Oral)   Ht 5\' 8"  (1.727 m)   Wt 193 lb (87.5 kg)   SpO2 98%   BMI 29.35 kg/m   BP Readings from Last 3 Encounters:  02/10/17 (!) 148/84  08/06/16 (!) 142/84  11/09/15 128/60    Wt Readings from Last 3 Encounters:  02/10/17 193 lb (87.5 kg)  08/06/16 199 lb (90.3 kg)  11/09/15 201 lb (91.2 kg)    Physical Exam  Constitutional: He is oriented to person, place, and time. He appears well-developed. No distress.  NAD  HENT:  Mouth/Throat: Oropharynx is clear and moist.  Eyes: Conjunctivae are normal. Pupils are equal, round, and reactive to light.  Neck: Normal range of motion. No JVD present. No thyromegaly present.  Cardiovascular: Normal rate, regular rhythm, normal heart sounds and intact distal pulses. Exam reveals no gallop and no friction rub.  No murmur heard. Pulmonary/Chest: Effort normal and breath sounds  normal. No respiratory distress. He has no wheezes. He has no rales. He exhibits no tenderness.  Abdominal: Soft. Bowel sounds are normal. He exhibits no distension and no mass. There is no tenderness. There is no rebound and no guarding.  Musculoskeletal: Normal range of motion. He exhibits no edema or tenderness.  Lymphadenopathy:    He has no cervical adenopathy.  Neurological: He is alert and oriented to person, place, and time. He has normal reflexes. No cranial nerve deficit. He exhibits normal muscle tone. He displays a negative Romberg sign. Coordination and gait  normal.  Skin: Skin is warm and dry. No rash noted.  Psychiatric: He has a normal mood and affect. His behavior is normal. Judgment and thought content normal.  geographic tongue  Lab Results  Component Value Date   WBC 8.6 08/06/2016   HGB 14.3 08/06/2016   HCT 40.9 08/06/2016   PLT 244.0 08/06/2016   GLUCOSE 109 (H) 08/06/2016   CHOL 192 08/06/2016   TRIG 334.0 (H) 08/06/2016   HDL 42.30 08/06/2016   LDLDIRECT 114.0 08/06/2016   LDLCALC 143 (H) 03/23/2014   ALT 40 08/06/2016   AST 37 08/06/2016   NA 143 08/06/2016   K 3.3 (L) 08/06/2016   CL 105 08/06/2016   CREATININE 0.87 08/06/2016   BUN 14 08/06/2016   CO2 26 08/06/2016   TSH 3.10 08/06/2016   PSA 0.75 08/06/2016    No results found.  Assessment & Plan:   There are no diagnoses linked to this encounter. I have discontinued Marny Lowenstein. [place] "Charlie"'s KLOR-CON M20. I am also having him maintain his aspirin, vitamin C, Fish Oil, Cholecalciferol, traZODone, zolpidem, traMADol, ibuprofen, losartan, and amLODipine.  No orders of the defined types were placed in this encounter.    Follow-up: No Follow-up on file.  Walker Kehr, MD

## 2017-02-10 NOTE — Assessment & Plan Note (Signed)
Losartan 

## 2017-02-10 NOTE — Assessment & Plan Note (Signed)
Trazodone, Zolpidem for insomnia

## 2017-02-10 NOTE — Patient Instructions (Signed)
Use [employer] Peroxicare tooth paste  

## 2017-02-10 NOTE — Assessment & Plan Note (Signed)
Labs ?rash from klor-con

## 2017-02-10 NOTE — Assessment & Plan Note (Signed)
Labs

## 2017-02-10 NOTE — Addendum Note (Signed)
Addended by: Karren Cobble on: 02/10/2017 09:59 AM   Modules accepted: Orders

## 2017-02-12 ENCOUNTER — Other Ambulatory Visit: Payer: Self-pay | Admitting: Internal Medicine

## 2017-02-12 MED ORDER — POTASSIUM CHLORIDE CRYS ER 20 MEQ PO TBCR: 20.0000 meq | EXTENDED_RELEASE_TABLET | Freq: Every day | ORAL | 3 refills | Status: DC

## 2017-02-17 ENCOUNTER — Other Ambulatory Visit: Payer: 59

## 2017-02-17 ENCOUNTER — Ambulatory Visit (INDEPENDENT_AMBULATORY_CARE_PROVIDER_SITE_OTHER): Payer: 59 | Admitting: Internal Medicine

## 2017-02-17 ENCOUNTER — Telehealth: Payer: Self-pay

## 2017-02-17 ENCOUNTER — Encounter: Payer: Self-pay | Admitting: Internal Medicine

## 2017-02-17 DIAGNOSIS — Z113 Encounter for screening for infections with a predominantly sexual mode of transmission: Secondary | ICD-10-CM

## 2017-02-17 DIAGNOSIS — I10 Essential (primary) hypertension: Secondary | ICD-10-CM

## 2017-02-17 DIAGNOSIS — Z0001 Encounter for general adult medical examination with abnormal findings: Secondary | ICD-10-CM | POA: Insufficient documentation

## 2017-02-17 DIAGNOSIS — Z1211 Encounter for screening for malignant neoplasm of colon: Secondary | ICD-10-CM | POA: Diagnosis not present

## 2017-02-17 DIAGNOSIS — M25511 Pain in right shoulder: Secondary | ICD-10-CM

## 2017-02-17 LAB — HIV ANTIBODY (ROUTINE TESTING W REFLEX): HIV 1&2 Ab, 4th Generation: NONREACTIVE

## 2017-02-17 MED ORDER — TRAMADOL HCL 50 MG PO TABS: 50.0000 mg | ORAL_TABLET | Freq: Two times a day (BID) | ORAL | 0 refills | Status: DC | PRN

## 2017-02-17 MED ORDER — CYCLOBENZAPRINE HCL 5 MG PO TABS: 5.0000 mg | ORAL_TABLET | Freq: Three times a day (TID) | ORAL | 0 refills | Status: DC | PRN

## 2017-02-17 NOTE — Assessment & Plan Note (Signed)
Chiropractor Tramadol, Flexeril prn

## 2017-02-17 NOTE — Progress Notes (Signed)
Subjective:  Patient ID: Austin Aguilar, male    DOB: 1955-11-04  Age: 61 y.o. MRN: [id number]  CC: No chief complaint on file.   HPI Fady Stamps Seever presents for a cologuard question The pt wants STD testing done C/o R shoulder pain - starined  Outpatient Medications Prior to Visit  Medication Sig Dispense Refill  . amLODipine (NORVASC) 5 MG tablet TAKE 1 TABLET BY MOUTH EVERY DAY 90 tablet 1  . Ascorbic Acid (VITAMIN C) 1000 MG tablet Take 1,000 mg by mouth daily.     Marland Kitchen aspirin 81 MG EC tablet Take 81 mg by mouth daily.     . Cholecalciferol 1000 units tablet Take 1,000 Units by mouth daily.    Marland Kitchen ibuprofen (ADVIL,MOTRIN) 600 MG tablet Take 1 tablet (600 mg total) by mouth every 8 (eight) hours as needed for moderate pain. 60 tablet 1  . losartan (COZAAR) 100 MG tablet Take 1 tablet (100 mg total) by mouth daily. 90 tablet 3  . Omega-3 Fatty Acids (FISH OIL) 1200 MG CAPS Take 1 capsule by mouth daily.    . potassium chloride SA (K-DUR,KLOR-CON) 20 MEQ tablet Take 1 tablet (20 mEq total) by mouth daily. 90 tablet 3  . traMADol (ULTRAM) 50 MG tablet Take 1-2 tablets (50-100 mg total) by mouth 2 (two) times daily as needed. 60 tablet 0  . traZODone (DESYREL) 150 MG tablet Take 0.5-1 tablets (75-150 mg total) by mouth at bedtime. 90 tablet 3  . zolpidem (AMBIEN) 10 MG tablet Take 0.5-1 tablets (5-10 mg total) by mouth at bedtime as needed for sleep. 90 tablet 0   No facility-administered medications prior to visit.     ROS Review of Systems  Constitutional: Negative for appetite change, fatigue and unexpected weight change.  HENT: Negative for congestion, nosebleeds, sneezing, sore throat and trouble swallowing.   Eyes: Negative for itching and visual disturbance.  Respiratory: Negative for cough.   Cardiovascular: Negative for chest pain, palpitations and leg swelling.  Gastrointestinal: Negative for abdominal distention, blood in stool, diarrhea and nausea.  Genitourinary:  Negative for frequency and hematuria.  Musculoskeletal: Negative for back pain, gait problem, joint swelling and neck pain.  Skin: Negative for rash.  Neurological: Negative for dizziness, tremors, speech difficulty and weakness.  Psychiatric/Behavioral: Negative for agitation, dysphoric mood, sleep disturbance and suicidal ideas. The patient is not nervous/anxious.     Objective:  BP 122/68 (BP Location: Left Arm, Patient Position: Sitting, Cuff Size: Large)   Pulse 71   Temp 97.9 F (36.6 C) (Oral)   Ht 5\' 8"  (1.727 m)   Wt 193 lb (87.5 kg)   SpO2 99%   BMI 29.35 kg/m   BP Readings from Last 3 Encounters:  02/17/17 122/68  02/10/17 (!) 148/84  08/06/16 (!) 142/84    Wt Readings from Last 3 Encounters:  02/17/17 193 lb (87.5 kg)  02/10/17 193 lb (87.5 kg)  08/06/16 199 lb (90.3 kg)    Physical Exam  Constitutional: He is oriented to person, place, and time. He appears well-developed. No distress.  NAD  HENT:  Mouth/Throat: Oropharynx is clear and moist.  Eyes: Conjunctivae are normal. Pupils are equal, round, and reactive to light.  Neck: Normal range of motion. No JVD present. No thyromegaly present.  Cardiovascular: Normal rate, regular rhythm, normal heart sounds and intact distal pulses. Exam reveals no gallop and no friction rub.  No murmur heard. Pulmonary/Chest: Effort normal and breath sounds normal. No respiratory distress. He has  no wheezes. He has no rales. He exhibits no tenderness.  Abdominal: Soft. Bowel sounds are normal. He exhibits no distension and no mass. There is no tenderness. There is no rebound and no guarding.  Musculoskeletal: Normal range of motion. He exhibits tenderness. He exhibits no edema.  Lymphadenopathy:    He has no cervical adenopathy.  Neurological: He is alert and oriented to person, place, and time. He has normal reflexes. No cranial nerve deficit. He exhibits normal muscle tone. He displays a negative Romberg sign. Coordination and  gait normal.  Skin: Skin is warm and dry. No rash noted.  Psychiatric: He has a normal mood and affect. His behavior is normal. Judgment and thought content normal.   R trap, R rhomboids - tender   Lab Results  Component Value Date   WBC 8.6 08/06/2016   HGB 14.3 08/06/2016   HCT 40.9 08/06/2016   PLT 244.0 08/06/2016   GLUCOSE 84 02/10/2017   CHOL 192 08/06/2016   TRIG 334.0 (H) 08/06/2016   HDL 42.30 08/06/2016   LDLDIRECT 114.0 08/06/2016   LDLCALC 143 (H) 03/23/2014   ALT 40 08/06/2016   AST 37 08/06/2016   NA 143 02/10/2017   K 3.0 (L) 02/10/2017   CL 100 02/10/2017   CREATININE 0.74 02/10/2017   BUN 13 02/10/2017   CO2 26 02/10/2017   TSH 2.60 02/10/2017   PSA 0.75 08/06/2016   HGBA1C 5.1 02/10/2017    No results found.  Assessment & Plan:   There are no diagnoses linked to this encounter. I am having Marny Lowenstein. [place] "Charlie" maintain his aspirin, vitamin C, Fish Oil, Cholecalciferol, traZODone, zolpidem, traMADol, ibuprofen, losartan, amLODipine, and potassium chloride SA.  No orders of the defined types were placed in this encounter.    Follow-up: No Follow-up on file.  Walker Kehr, MD

## 2017-02-17 NOTE — Telephone Encounter (Signed)
Order [id number]

## 2017-02-17 NOTE — Assessment & Plan Note (Signed)
Losartan 

## 2017-02-17 NOTE — Assessment & Plan Note (Signed)
No sx's. He is in a new relationship.  Labs Safe sex discussed

## 2017-02-17 NOTE — Assessment & Plan Note (Signed)
Discussed colonoscopy vs cologuard The pt would like a cologuard test

## 2017-02-18 LAB — GC/CHLAMYDIA PROBE AMP
CHLAMYDIA, DNA PROBE: NEGATIVE
Neisseria gonorrhoeae by PCR: NEGATIVE

## 2017-03-06 ENCOUNTER — Other Ambulatory Visit: Payer: Self-pay

## 2017-03-06 MED ORDER — POTASSIUM CHLORIDE ER 10 MEQ PO CPCR: 10.0000 meq | ORAL_CAPSULE | Freq: Two times a day (BID) | ORAL | 1 refills | Status: DC

## 2017-04-09 DIAGNOSIS — Z1212 Encounter for screening for malignant neoplasm of rectum: Secondary | ICD-10-CM | POA: Diagnosis not present

## 2017-04-09 DIAGNOSIS — Z1211 Encounter for screening for malignant neoplasm of colon: Secondary | ICD-10-CM | POA: Diagnosis not present

## 2017-04-09 LAB — COLOGUARD: COLOGUARD: NEGATIVE

## 2017-05-15 ENCOUNTER — Encounter: Payer: Self-pay | Admitting: Internal Medicine

## 2017-05-15 NOTE — Progress Notes (Signed)
Outside notes received. Information abstracted. Notes sent to scan.  

## 2017-05-26 ENCOUNTER — Other Ambulatory Visit: Payer: Self-pay | Admitting: Internal Medicine

## 2017-08-08 ENCOUNTER — Ambulatory Visit (INDEPENDENT_AMBULATORY_CARE_PROVIDER_SITE_OTHER): Payer: 59 | Admitting: Internal Medicine

## 2017-08-08 ENCOUNTER — Encounter: Payer: Self-pay | Admitting: Internal Medicine

## 2017-08-08 ENCOUNTER — Other Ambulatory Visit (INDEPENDENT_AMBULATORY_CARE_PROVIDER_SITE_OTHER): Payer: 59

## 2017-08-08 VITALS — BP 118/76 | HR 61 | Temp 98.1°F | Ht 68.0 in | Wt 175.0 lb

## 2017-08-08 DIAGNOSIS — R739 Hyperglycemia, unspecified: Secondary | ICD-10-CM

## 2017-08-08 DIAGNOSIS — M6530 Trigger finger, unspecified finger: Secondary | ICD-10-CM | POA: Insufficient documentation

## 2017-08-08 DIAGNOSIS — M65332 Trigger finger, left middle finger: Secondary | ICD-10-CM

## 2017-08-08 DIAGNOSIS — G4700 Insomnia, unspecified: Secondary | ICD-10-CM

## 2017-08-08 DIAGNOSIS — F4321 Adjustment disorder with depressed mood: Secondary | ICD-10-CM

## 2017-08-08 DIAGNOSIS — Z0000 Encounter for general adult medical examination without abnormal findings: Secondary | ICD-10-CM | POA: Diagnosis not present

## 2017-08-08 DIAGNOSIS — F411 Generalized anxiety disorder: Secondary | ICD-10-CM

## 2017-08-08 DIAGNOSIS — I10 Essential (primary) hypertension: Secondary | ICD-10-CM

## 2017-08-08 LAB — URINALYSIS
Bilirubin Urine: NEGATIVE
Hgb urine dipstick: NEGATIVE
KETONES UR: NEGATIVE
Leukocytes, UA: NEGATIVE
Nitrite: NEGATIVE
PH: 6 (ref 5.0–8.0)
SPECIFIC GRAVITY, URINE: 1.02 (ref 1.000–1.030)
Total Protein, Urine: NEGATIVE
URINE GLUCOSE: NEGATIVE
Urobilinogen, UA: 0.2 (ref 0.0–1.0)

## 2017-08-08 LAB — CBC WITH DIFFERENTIAL/PLATELET
BASOS ABS: 0.1 10*3/uL (ref 0.0–0.1)
Basophils Relative: 0.9 % (ref 0.0–3.0)
EOS ABS: 0.1 10*3/uL (ref 0.0–0.7)
Eosinophils Relative: 2.2 % (ref 0.0–5.0)
HEMATOCRIT: 39.7 % (ref 39.0–52.0)
Hemoglobin: 13.6 g/dL (ref 13.0–17.0)
LYMPHS PCT: 24.4 % (ref 12.0–46.0)
Lymphs Abs: 1.5 10*3/uL (ref 0.7–4.0)
MCHC: 34.4 g/dL (ref 30.0–36.0)
MCV: 87.8 fl (ref 78.0–100.0)
MONO ABS: 0.4 10*3/uL (ref 0.1–1.0)
Monocytes Relative: 7.2 % (ref 3.0–12.0)
NEUTROS ABS: 3.9 10*3/uL (ref 1.4–7.7)
NEUTROS PCT: 65.3 % (ref 43.0–77.0)
PLATELETS: 221 10*3/uL (ref 150.0–400.0)
RBC: 4.52 Mil/uL (ref 4.22–5.81)
RDW: 12.7 % (ref 11.5–15.5)
WBC: 6 10*3/uL (ref 4.0–10.5)

## 2017-08-08 LAB — LIPID PANEL
CHOL/HDL RATIO: 4
Cholesterol: 145 mg/dL (ref 0–200)
HDL: 37.8 mg/dL — ABNORMAL LOW (ref >39.00)
LDL CALC: 90 mg/dL (ref 0–99)
NonHDL: 106.86
TRIGLYCERIDES: 83 mg/dL (ref 0.0–149.0)
VLDL: 16.6 mg/dL (ref 0.0–40.0)

## 2017-08-08 LAB — PSA: PSA: 0.74 ng/mL (ref 0.10–4.00)

## 2017-08-08 LAB — BASIC METABOLIC PANEL
BUN: 14 mg/dL (ref 6–23)
CALCIUM: 9.8 mg/dL (ref 8.4–10.5)
CO2: 26 meq/L (ref 19–32)
CREATININE: 0.79 mg/dL (ref 0.40–1.50)
Chloride: 107 mEq/L (ref 96–112)
GFR: 105.87 mL/min (ref >60.00)
GLUCOSE: 101 mg/dL — AB (ref 70–99)
Potassium: 3.8 mEq/L (ref 3.5–5.1)
SODIUM: 142 meq/L (ref 135–145)

## 2017-08-08 LAB — HEPATIC FUNCTION PANEL
ALK PHOS: 77 U/L (ref 39–117)
ALT: 15 U/L (ref 0–53)
AST: 16 U/L (ref 0–37)
Albumin: 4.6 g/dL (ref 3.5–5.2)
BILIRUBIN TOTAL: 0.5 mg/dL (ref 0.2–1.2)
Bilirubin, Direct: 0.1 mg/dL (ref 0.0–0.3)
Total Protein: 6.6 g/dL (ref 6.0–8.3)

## 2017-08-08 LAB — TSH: TSH: 2.86 u[IU]/mL (ref 0.35–4.50)

## 2017-08-08 NOTE — Assessment & Plan Note (Signed)
Lost wt 30 lbs on diet. He stopped drinking (was drinking 3-5 drinks/night)

## 2017-08-08 NOTE — Assessment & Plan Note (Signed)
Not taking meds 

## 2017-08-08 NOTE — Assessment & Plan Note (Addendum)
We discussed age appropriate health related issues, including available/recomended screening tests and vaccinations. We discussed a need for adhering to healthy diet and exercise. Labs were ordered to be later reviewed . All questions were answered.  Lost wt 30 lbs on diet. He stopped drinking (was drinking 3-5 drinks/night)  cologuard due 2021

## 2017-08-08 NOTE — Assessment & Plan Note (Signed)
Lost wt 30 lbs on diet. He stopped drinking (was drinking 3-5 drinks/night) D/c Losartan

## 2017-08-08 NOTE — Assessment & Plan Note (Signed)
Off meds 

## 2017-08-08 NOTE — Patient Instructions (Signed)
Trigger Finger Trigger finger (stenosing tenosynovitis) is a condition that causes a finger to get stuck in a bent position. Each finger has a tough, cord-like tissue that connects muscle to bone (tendon), and each tendon is surrounded by a tunnel of tissue (tendon sheath). To move your finger, your tendon needs to slide freely through the sheath. Trigger finger happens when the tendon or the sheath thickens, making it difficult to move your finger. Trigger finger can affect any finger or a thumb. It may affect more than one finger. Mild cases may clear up with rest and medicine. Severe cases require more treatment. What are the causes? Trigger finger is caused by a thickened finger tendon or tendon sheath. The cause of this thickening is not known. What increases the risk? The following factors may make you more likely to develop this condition:  Doing activities that require a strong grip.  Having rheumatoid arthritis, gout, or diabetes.  Being 40-60 years old.  Being a woman.  What are the signs or symptoms? Symptoms of this condition include:  Pain when bending or straightening your finger.  Tenderness or swelling where your finger attaches to the palm of your hand.  A lump in the palm of your hand or on the inside of your finger.  Hearing a popping sound when you try to straighten your finger.  Feeling a popping, catching, or locking sensation when you try to straighten your finger.  Being unable to straighten your finger.  How is this diagnosed? This condition is diagnosed based on your symptoms and a physical exam. How is this treated? This condition may be treated by:  Resting your finger and avoiding activities that make symptoms worse.  Wearing a finger splint to keep your finger in a slightly bent position.  Taking NSAIDs to relieve pain and swelling.  Injecting medicine (steroids) into the tendon sheath to reduce swelling and irritation. Injections may need to be  repeated.  Having surgery to open the tendon sheath. This may be done if other treatments do not work and you cannot straighten your finger. You may need physical therapy after surgery.  Follow these instructions at home:  Use moist heat to help reduce pain and swelling as told by your health care provider.  Rest your finger and avoid activities that make pain worse. Return to normal activities as told by your health care provider.  If you have a splint, wear it as told by your health care provider.  Take over-the-counter and prescription medicines only as told by your health care provider.  Keep all follow-up visits as told by your health care provider. This is important. Contact a health care provider if:  Your symptoms are not improving with home care. Summary  Trigger finger (stenosing tenosynovitis) causes your finger to get stuck in a bent position, and it can make it difficult and painful to straighten your finger.  This condition develops when a finger tendon or tendon sheath thickens.  Treatment starts with resting, wearing a splint, and taking NSAIDs.  In severe cases, surgery to open the tendon sheath may be needed. This information is not intended to replace advice given to you by your health care provider. Make sure you discuss any questions you have with your health care provider. Document Released: 12/23/2003 Document Revised: 02/13/2016 Document Reviewed: 02/13/2016 Elsevier Interactive Patient Education  2017 [employer]  

## 2017-08-08 NOTE — Progress Notes (Signed)
Subjective:  Patient ID: Austin Aguilar, male    DOB: 02/20/1956  Age: 62 y.o. MRN: [id number]  CC: No chief complaint on file.   HPI Austin Aguilar presents for HTN, depression, insomnia f/u. Lost wt 30 lbs on diet. He stopped drinking (was drinking 3-5 drinks/night)  Well exam  Outpatient Medications Prior to Visit  Medication Sig Dispense Refill  . amLODipine (NORVASC) 5 MG tablet TAKE 1 TABLET BY MOUTH EVERY DAY 90 tablet 0  . Ascorbic Acid (VITAMIN C) 1000 MG tablet Take 1,000 mg by mouth daily.     Marland Kitchen aspirin 81 MG EC tablet Take 81 mg by mouth daily.     . Cholecalciferol 1000 units tablet Take 1,000 Units by mouth daily.    . cyclobenzaprine (FLEXERIL) 5 MG tablet Take 1 tablet (5 mg total) by mouth 3 (three) times daily as needed for muscle spasms. 30 tablet 0  . ibuprofen (ADVIL,MOTRIN) 600 MG tablet Take 1 tablet (600 mg total) by mouth every 8 (eight) hours as needed for moderate pain. 60 tablet 1  . losartan (COZAAR) 100 MG tablet Take 1 tablet (100 mg total) by mouth daily. 90 tablet 3  . Omega-3 Fatty Acids (FISH OIL) 1200 MG CAPS Take 1 capsule by mouth daily.    . potassium chloride (MICRO-K) 10 MEQ CR capsule Take 1 capsule (10 mEq total) by mouth 2 (two) times daily. 180 capsule 1  . potassium chloride SA (K-DUR,KLOR-CON) 20 MEQ tablet Take 1 tablet (20 mEq total) by mouth daily. 90 tablet 3  . traMADol (ULTRAM) 50 MG tablet Take 1-2 tablets (50-100 mg total) by mouth 2 (two) times daily as needed. 60 tablet 0  . traZODone (DESYREL) 150 MG tablet Take 0.5-1 tablets (75-150 mg total) by mouth at bedtime. 90 tablet 3  . zolpidem (AMBIEN) 10 MG tablet Take 0.5-1 tablets (5-10 mg total) by mouth at bedtime as needed for sleep. 90 tablet 0   No facility-administered medications prior to visit.     ROS: Review of Systems  Constitutional: Negative for appetite change, fatigue and unexpected weight change.  HENT: Negative for congestion, nosebleeds, sneezing, sore  throat and trouble swallowing.   Eyes: Negative for itching and visual disturbance.  Respiratory: Negative for cough.   Cardiovascular: Negative for chest pain, palpitations and leg swelling.  Gastrointestinal: Negative for abdominal distention, blood in stool, diarrhea and nausea.  Genitourinary: Negative for frequency and hematuria.  Musculoskeletal: Negative for back pain, gait problem, joint swelling and neck pain.  Skin: Negative for rash.  Neurological: Negative for dizziness, tremors, speech difficulty and weakness.  Psychiatric/Behavioral: Negative for agitation, dysphoric mood, sleep disturbance and suicidal ideas. The patient is not nervous/anxious.   prostate 1+ Looks well  Objective:  BP 118/76 (BP Location: Left Arm, Patient Position: Sitting, Cuff Size: Normal)   Pulse 61   Temp 98.1 F (36.7 C) (Oral)   Ht 5\' 8"  (1.727 m)   Wt 175 lb (79.4 kg)   SpO2 98%   BMI 26.61 kg/m   BP Readings from Last 3 Encounters:  08/08/17 118/76  02/17/17 122/68  02/10/17 (!) 148/84    Wt Readings from Last 3 Encounters:  08/08/17 175 lb (79.4 kg)  02/17/17 193 lb (87.5 kg)  02/10/17 193 lb (87.5 kg)    Physical Exam  Constitutional: He is oriented to person, place, and time. He appears well-developed. No distress.  NAD  HENT:  Mouth/Throat: Oropharynx is clear and moist.  Eyes: Pupils are  equal, round, and reactive to light. Conjunctivae are normal.  Neck: Normal range of motion. No JVD present. No thyromegaly present.  Cardiovascular: Normal rate, regular rhythm, normal heart sounds and intact distal pulses. Exam reveals no gallop and no friction rub.  No murmur heard. Pulmonary/Chest: Effort normal and breath sounds normal. No respiratory distress. He has no wheezes. He has no rales. He exhibits no tenderness.  Abdominal: Soft. Bowel sounds are normal. He exhibits no distension and no mass. There is no tenderness. There is no rebound and no guarding.  Genitourinary: Rectum  normal and prostate normal. Rectal exam shows guaiac negative stool.  Musculoskeletal: Normal range of motion. He exhibits no edema or tenderness.  Lymphadenopathy:    He has no cervical adenopathy.  Neurological: He is alert and oriented to person, place, and time. He has normal reflexes. No cranial nerve deficit. He exhibits normal muscle tone. He displays a negative Romberg sign. Coordination and gait normal.  Skin: Skin is warm and dry. No rash noted.  Psychiatric: He has a normal mood and affect. His behavior is normal. Judgment and thought content normal.   L 3d finger is triggering Lab Results  Component Value Date   WBC 8.6 08/06/2016   HGB 14.3 08/06/2016   HCT 40.9 08/06/2016   PLT 244.0 08/06/2016   GLUCOSE 84 02/10/2017   CHOL 192 08/06/2016   TRIG 334.0 (H) 08/06/2016   HDL 42.30 08/06/2016   LDLDIRECT 114.0 08/06/2016   LDLCALC 143 (H) 03/23/2014   ALT 40 08/06/2016   AST 37 08/06/2016   NA 143 02/10/2017   K 3.0 (L) 02/10/2017   CL 100 02/10/2017   CREATININE 0.74 02/10/2017   BUN 13 02/10/2017   CO2 26 02/10/2017   TSH 2.60 02/10/2017   PSA 0.75 08/06/2016   HGBA1C 5.1 02/10/2017    No results found.  Assessment & Plan:   There are no diagnoses linked to this encounter.   No orders of the defined types were placed in this encounter.    Follow-up: No follow-ups on file.  Walker Kehr, MD

## 2017-08-08 NOTE — Assessment & Plan Note (Signed)
Better  

## 2017-08-08 NOTE — Assessment & Plan Note (Signed)
L 3d finger is triggering  discussed

## 2017-09-02 ENCOUNTER — Other Ambulatory Visit: Payer: Self-pay | Admitting: Internal Medicine

## 2017-09-03 NOTE — Telephone Encounter (Signed)
Has Amlodipine been d/c'd?  I see where Losartan was at 08/08/17 OV. Please advise.

## 2017-09-06 ENCOUNTER — Other Ambulatory Visit: Payer: Self-pay | Admitting: Internal Medicine

## 2018-06-22 DIAGNOSIS — R509 Fever, unspecified: Secondary | ICD-10-CM | POA: Diagnosis not present

## 2018-06-22 DIAGNOSIS — R05 Cough: Secondary | ICD-10-CM | POA: Diagnosis not present

## 2018-06-22 DIAGNOSIS — R066 Hiccough: Secondary | ICD-10-CM | POA: Diagnosis not present

## 2018-08-11 ENCOUNTER — Encounter: Payer: 59 | Admitting: Internal Medicine

## 2018-09-08 ENCOUNTER — Other Ambulatory Visit: Payer: Self-pay

## 2018-09-08 ENCOUNTER — Encounter: Payer: Self-pay | Admitting: Internal Medicine

## 2018-09-08 ENCOUNTER — Ambulatory Visit (INDEPENDENT_AMBULATORY_CARE_PROVIDER_SITE_OTHER): Payer: 59 | Admitting: Internal Medicine

## 2018-09-08 ENCOUNTER — Other Ambulatory Visit (INDEPENDENT_AMBULATORY_CARE_PROVIDER_SITE_OTHER): Payer: 59

## 2018-09-08 VITALS — BP 124/76 | HR 63 | Temp 97.7°F | Ht 68.0 in | Wt 180.0 lb

## 2018-09-08 DIAGNOSIS — Z0000 Encounter for general adult medical examination without abnormal findings: Secondary | ICD-10-CM | POA: Diagnosis not present

## 2018-09-08 DIAGNOSIS — I10 Essential (primary) hypertension: Secondary | ICD-10-CM

## 2018-09-08 LAB — URINALYSIS
Bilirubin Urine: NEGATIVE
Hgb urine dipstick: NEGATIVE
Ketones, ur: NEGATIVE
Leukocytes,Ua: NEGATIVE
Nitrite: NEGATIVE
Specific Gravity, Urine: 1.01 (ref 1.000–1.030)
Total Protein, Urine: NEGATIVE
Urine Glucose: NEGATIVE
Urobilinogen, UA: 0.2 (ref 0.0–1.0)
pH: 7 (ref 5.0–8.0)

## 2018-09-08 LAB — TSH: TSH: 3.13 u[IU]/mL (ref 0.35–4.50)

## 2018-09-08 LAB — CBC WITH DIFFERENTIAL/PLATELET
Basophils Absolute: 0 10*3/uL (ref 0.0–0.1)
Basophils Relative: 0.6 % (ref 0.0–3.0)
Eosinophils Absolute: 0.1 10*3/uL (ref 0.0–0.7)
Eosinophils Relative: 1.6 % (ref 0.0–5.0)
HCT: 40.8 % (ref 39.0–52.0)
Hemoglobin: 13.9 g/dL (ref 13.0–17.0)
Lymphocytes Relative: 25.2 % (ref 12.0–46.0)
Lymphs Abs: 1.7 10*3/uL (ref 0.7–4.0)
MCHC: 34 g/dL (ref 30.0–36.0)
MCV: 88.6 fl (ref 78.0–100.0)
Monocytes Absolute: 0.4 10*3/uL (ref 0.1–1.0)
Monocytes Relative: 6.5 % (ref 3.0–12.0)
Neutro Abs: 4.4 10*3/uL (ref 1.4–7.7)
Neutrophils Relative %: 66.1 % (ref 43.0–77.0)
Platelets: 216 10*3/uL (ref 150.0–400.0)
RBC: 4.61 Mil/uL (ref 4.22–5.81)
RDW: 13.3 % (ref 11.5–15.5)
WBC: 6.6 10*3/uL (ref 4.0–10.5)

## 2018-09-08 LAB — HEPATIC FUNCTION PANEL
ALT: 17 U/L (ref 0–53)
AST: 19 U/L (ref 0–37)
Albumin: 4.8 g/dL (ref 3.5–5.2)
Alkaline Phosphatase: 73 U/L (ref 39–117)
Bilirubin, Direct: 0.1 mg/dL (ref 0.0–0.3)
Total Bilirubin: 0.5 mg/dL (ref 0.2–1.2)
Total Protein: 6.5 g/dL (ref 6.0–8.3)

## 2018-09-08 LAB — BASIC METABOLIC PANEL
BUN: 13 mg/dL (ref 6–23)
CO2: 29 mEq/L (ref 19–32)
Calcium: 9.7 mg/dL (ref 8.4–10.5)
Chloride: 104 mEq/L (ref 96–112)
Creatinine, Ser: 0.75 mg/dL (ref 0.40–1.50)
GFR: 105.38 mL/min (ref >60.00)
Glucose, Bld: 89 mg/dL (ref 70–99)
Potassium: 3.9 mEq/L (ref 3.5–5.1)
Sodium: 140 mEq/L (ref 135–145)

## 2018-09-08 LAB — LIPID PANEL
Cholesterol: 156 mg/dL (ref 0–200)
HDL: 43.1 mg/dL (ref >39.00)
LDL Cholesterol: 96 mg/dL (ref 0–99)
NonHDL: 112.63
Total CHOL/HDL Ratio: 4
Triglycerides: 81 mg/dL (ref 0.0–149.0)
VLDL: 16.2 mg/dL (ref 0.0–40.0)

## 2018-09-08 LAB — PSA: PSA: 0.69 ng/mL (ref 0.10–4.00)

## 2018-09-08 NOTE — Patient Instructions (Signed)

## 2018-09-08 NOTE — Assessment & Plan Note (Addendum)
We discussed age appropriate health related issues, including available/recomended screening tests and vaccinations. We discussed a need for adhering to healthy diet and exercise. Labs were ordered to be later reviewed . All questions were answered. cardiac CT scan for calcium scoring  cologuard due 2021

## 2018-09-08 NOTE — Progress Notes (Signed)
Subjective:  Patient ID: Austin Aguilar, male    DOB: Dec 22, 1955  Age: 63 y.o. MRN: [id number]  CC: No chief complaint on file.   HPI Austin Aguilar presents for well exam  Outpatient Medications Prior to Visit  Medication Sig Dispense Refill  . amLODipine (NORVASC) 5 MG tablet TAKE 1 TABLET BY MOUTH EVERY DAY 90 tablet 3  . Ascorbic Acid (VITAMIN C) 1000 MG tablet Take 1,000 mg by mouth daily.     . Cholecalciferol 1000 units tablet Take 1,000 Units by mouth daily.    Marland Kitchen ibuprofen (ADVIL,MOTRIN) 600 MG tablet Take 1 tablet (600 mg total) by mouth every 8 (eight) hours as needed for moderate pain. 60 tablet 1  . losartan (COZAAR) 100 MG tablet TAKE 1 TABLET BY MOUTH EVERY DAY 90 tablet 3  . Omega-3 Fatty Acids (FISH OIL) 1200 MG CAPS Take 1 capsule by mouth daily.    . potassium chloride (MICRO-K) 10 MEQ CR capsule TAKE 1 CAPSULE (10 MEQ TOTAL) BY MOUTH 2 (TWO) TIMES DAILY. 180 capsule 1  . traMADol (ULTRAM) 50 MG tablet Take 1-2 tablets (50-100 mg total) by mouth 2 (two) times daily as needed. 60 tablet 0   No facility-administered medications prior to visit.     ROS: Review of Systems  Constitutional: Negative for appetite change, fatigue and unexpected weight change.  HENT: Negative for congestion, nosebleeds, sneezing, sore throat and trouble swallowing.   Eyes: Negative for itching and visual disturbance.  Respiratory: Negative for cough.   Cardiovascular: Negative for chest pain, palpitations and leg swelling.  Gastrointestinal: Negative for abdominal distention, blood in stool, diarrhea and nausea.  Genitourinary: Negative for frequency and hematuria.  Musculoskeletal: Negative for back pain, gait problem, joint swelling and neck pain.  Skin: Negative for rash.  Neurological: Negative for dizziness, tremors, speech difficulty and weakness.  Psychiatric/Behavioral: Negative for agitation, dysphoric mood and sleep disturbance. The patient is not nervous/anxious.      Objective:  BP 124/76 (BP Location: Left Arm, Patient Position: Sitting, Cuff Size: Normal)   Pulse 63   Temp 97.7 F (36.5 C) (Oral)   Ht 5\' 8"  (1.727 m)   Wt 180 lb (81.6 kg)   SpO2 96%   BMI 27.37 kg/m   BP Readings from Last 3 Encounters:  09/08/18 124/76  08/08/17 118/76  02/17/17 122/68    Wt Readings from Last 3 Encounters:  09/08/18 180 lb (81.6 kg)  08/08/17 175 lb (79.4 kg)  02/17/17 193 lb (87.5 kg)    Physical Exam Constitutional:      General: He is not in acute distress.    Appearance: He is well-developed.     Comments: NAD  Eyes:     Conjunctiva/sclera: Conjunctivae normal.     Pupils: Pupils are equal, round, and reactive to light.  Neck:     Musculoskeletal: Normal range of motion.     Thyroid: No thyromegaly.     Vascular: No JVD.  Cardiovascular:     Rate and Rhythm: Normal rate and regular rhythm.     Heart sounds: Normal heart sounds. No murmur. No friction rub. No gallop.   Pulmonary:     Effort: Pulmonary effort is normal. No respiratory distress.     Breath sounds: Normal breath sounds. No wheezing or rales.  Chest:     Chest wall: No tenderness.  Abdominal:     General: Bowel sounds are normal. There is no distension.     Palpations: Abdomen is soft.  There is no mass.     Tenderness: There is no abdominal tenderness. There is no guarding or rebound.  Genitourinary:    Rectum: Normal. Guaiac result negative.  Musculoskeletal: Normal range of motion.        General: No tenderness.  Lymphadenopathy:     Cervical: No cervical adenopathy.  Skin:    General: Skin is warm and dry.     Findings: No rash.  Neurological:     Mental Status: He is alert and oriented to person, place, and time.     Cranial Nerves: No cranial nerve deficit.     Motor: No abnormal muscle tone.     Coordination: Coordination normal.     Gait: Gait normal.     Deep Tendon Reflexes: Reflexes are normal and symmetric.  Psychiatric:        Behavior: Behavior  normal.        Thought Content: Thought content normal.        Judgment: Judgment normal.    Prostate 1+ Lab Results  Component Value Date   WBC 6.0 08/08/2017   HGB 13.6 08/08/2017   HCT 39.7 08/08/2017   PLT 221.0 08/08/2017   GLUCOSE 101 (H) 08/08/2017   CHOL 145 08/08/2017   TRIG 83.0 08/08/2017   HDL 37.80 (L) 08/08/2017   LDLDIRECT 114.0 08/06/2016   LDLCALC 90 08/08/2017   ALT 15 08/08/2017   AST 16 08/08/2017   NA 142 08/08/2017   K 3.8 08/08/2017   CL 107 08/08/2017   CREATININE 0.79 08/08/2017   BUN 14 08/08/2017   CO2 26 08/08/2017   TSH 2.86 08/08/2017   PSA 0.74 08/08/2017   HGBA1C 5.1 02/10/2017    No results found.  Assessment & Plan:   There are no diagnoses linked to this encounter.   No orders of the defined types were placed in this encounter.    Follow-up: No follow-ups on file.  Walker Kehr, MD

## 2018-09-08 NOTE — Assessment & Plan Note (Signed)
Off Rx 

## 2019-05-05 ENCOUNTER — Other Ambulatory Visit: Payer: Self-pay

## 2019-05-05 ENCOUNTER — Ambulatory Visit (INDEPENDENT_AMBULATORY_CARE_PROVIDER_SITE_OTHER): Payer: 59 | Admitting: Internal Medicine

## 2019-05-05 ENCOUNTER — Ambulatory Visit (INDEPENDENT_AMBULATORY_CARE_PROVIDER_SITE_OTHER): Payer: 59

## 2019-05-05 ENCOUNTER — Encounter: Payer: Self-pay | Admitting: Internal Medicine

## 2019-05-05 VITALS — BP 132/84 | HR 58 | Temp 98.3°F | Ht 68.0 in | Wt 181.0 lb

## 2019-05-05 DIAGNOSIS — E559 Vitamin D deficiency, unspecified: Secondary | ICD-10-CM

## 2019-05-05 DIAGNOSIS — M79604 Pain in right leg: Secondary | ICD-10-CM | POA: Diagnosis not present

## 2019-05-05 DIAGNOSIS — R202 Paresthesia of skin: Secondary | ICD-10-CM | POA: Diagnosis not present

## 2019-05-05 LAB — SEDIMENTATION RATE: Sed Rate: 3 mm/hr (ref 0–20)

## 2019-05-05 LAB — VITAMIN D 25 HYDROXY (VIT D DEFICIENCY, FRACTURES): VITD: 27.95 ng/mL — ABNORMAL LOW (ref 30.00–100.00)

## 2019-05-05 LAB — VITAMIN B12: Vitamin B-12: 324 pg/mL (ref 211–911)

## 2019-05-05 MED ORDER — METHYLPREDNISOLONE 4 MG PO TBPK: ORAL_TABLET | ORAL | 0 refills | Status: DC

## 2019-05-05 NOTE — Progress Notes (Signed)
Subjective:  Patient ID: Austin Aguilar, male    DOB: [birth year]  Age: 64 y.o. MRN: [id number]  CC: No chief complaint on file.   HPI Austin Aguilar presents for R>L leg pain - worse at night; R leg numbness at night C/o RLE weakness since Fall 2020. Ibuprofen helps...   Outpatient Medications Prior to Visit  Medication Sig Dispense Refill  . amLODipine (NORVASC) 5 MG tablet TAKE 1 TABLET BY MOUTH EVERY DAY 90 tablet 3  . Ascorbic Acid (VITAMIN C) 1000 MG tablet Take 1,000 mg by mouth daily.     . Cholecalciferol 1000 units tablet Take 1,000 Units by mouth daily.    Marland Kitchen ibuprofen (ADVIL,MOTRIN) 600 MG tablet Take 1 tablet (600 mg total) by mouth every 8 (eight) hours as needed for moderate pain. 60 tablet 1  . losartan (COZAAR) 100 MG tablet TAKE 1 TABLET BY MOUTH EVERY DAY 90 tablet 3  . Omega-3 Fatty Acids (FISH OIL) 1200 MG CAPS Take 1 capsule by mouth daily.    . potassium chloride (MICRO-K) 10 MEQ CR capsule TAKE 1 CAPSULE (10 MEQ TOTAL) BY MOUTH 2 (TWO) TIMES DAILY. 180 capsule 1  . traMADol (ULTRAM) 50 MG tablet Take 1-2 tablets (50-100 mg total) by mouth 2 (two) times daily as needed. 60 tablet 0   No facility-administered medications prior to visit.    ROS: Review of Systems  Constitutional: Negative for appetite change, fatigue and unexpected weight change.  HENT: Negative for congestion, nosebleeds, sneezing, sore throat and trouble swallowing.   Eyes: Negative for itching and visual disturbance.  Respiratory: Negative for cough.   Cardiovascular: Negative for chest pain, palpitations and leg swelling.  Gastrointestinal: Negative for abdominal distention, blood in stool, diarrhea and nausea.  Genitourinary: Negative for frequency and hematuria.  Musculoskeletal: Positive for back pain and gait problem. Negative for joint swelling and neck pain.  Skin: Negative for rash.  Neurological: Negative for dizziness, tremors, speech difficulty and weakness.   Psychiatric/Behavioral: Negative for agitation, dysphoric mood and sleep disturbance. The patient is not nervous/anxious.     Objective:  BP 132/84 (BP Location: Left Arm, Patient Position: Sitting, Cuff Size: Large)   Pulse (!) 58   Temp 98.3 F (36.8 C) (Oral)   Ht 5\' 8"  (1.727 m)   Wt 181 lb (82.1 kg)   SpO2 98%   BMI 27.52 kg/m   BP Readings from Last 3 Encounters:  05/05/19 132/84  09/08/18 124/76  08/08/17 118/76    Wt Readings from Last 3 Encounters:  05/05/19 181 lb (82.1 kg)  09/08/18 180 lb (81.6 kg)  08/08/17 175 lb (79.4 kg)    Physical Exam Constitutional:      General: He is not in acute distress.    Appearance: He is well-developed.     Comments: NAD  Eyes:     Conjunctiva/sclera: Conjunctivae normal.     Pupils: Pupils are equal, round, and reactive to light.  Neck:     Thyroid: No thyromegaly.     Vascular: No JVD.  Cardiovascular:     Rate and Rhythm: Normal rate and regular rhythm.     Heart sounds: Normal heart sounds. No murmur. No friction rub. No gallop.   Pulmonary:     Effort: Pulmonary effort is normal. No respiratory distress.     Breath sounds: Normal breath sounds. No wheezing or rales.  Chest:     Chest wall: No tenderness.  Abdominal:     General: Bowel sounds are  normal. There is no distension.     Palpations: Abdomen is soft. There is no mass.     Tenderness: There is no abdominal tenderness. There is no guarding or rebound.  Musculoskeletal:        General: No tenderness. Normal range of motion.     Cervical back: Normal range of motion.  Lymphadenopathy:     Cervical: No cervical adenopathy.  Skin:    General: Skin is warm and dry.     Findings: No rash.  Neurological:     Mental Status: He is alert and oriented to person, place, and time.     Cranial Nerves: No cranial nerve deficit.     Motor: No abnormal muscle tone.     Coordination: Coordination normal.     Gait: Gait normal.     Deep Tendon Reflexes: Reflexes are  normal and symmetric.  Psychiatric:        Behavior: Behavior normal.        Thought Content: Thought content normal.        Judgment: Judgment normal.   Str leg elev (-) B No atrophy, MS OK B  Lab Results  Component Value Date   WBC 6.6 09/08/2018   HGB 13.9 09/08/2018   HCT 40.8 09/08/2018   PLT 216.0 09/08/2018   GLUCOSE 89 09/08/2018   CHOL 156 09/08/2018   TRIG 81.0 09/08/2018   HDL 43.10 09/08/2018   LDLDIRECT 114.0 08/06/2016   LDLCALC 96 09/08/2018   ALT 17 09/08/2018   AST 19 09/08/2018   NA 140 09/08/2018   K 3.9 09/08/2018   CL 104 09/08/2018   CREATININE 0.75 09/08/2018   BUN 13 09/08/2018   CO2 29 09/08/2018   TSH 3.13 09/08/2018   PSA 0.69 09/08/2018   HGBA1C 5.1 02/10/2017    No results found.  Assessment & Plan:     Walker Kehr, MD

## 2019-05-06 ENCOUNTER — Other Ambulatory Visit: Payer: Self-pay | Admitting: Internal Medicine

## 2019-05-06 MED ORDER — VITAMIN D3 1.25 MG (50000 UT) PO CAPS: 1.0000 | ORAL_CAPSULE | ORAL | 0 refills | Status: DC

## 2019-09-09 ENCOUNTER — Encounter: Payer: 59 | Admitting: Internal Medicine

## 2020-02-02 ENCOUNTER — Other Ambulatory Visit: Payer: Self-pay

## 2020-02-02 ENCOUNTER — Encounter (INDEPENDENT_AMBULATORY_CARE_PROVIDER_SITE_OTHER): Payer: Self-pay

## 2020-02-02 ENCOUNTER — Encounter: Payer: Self-pay | Admitting: Internal Medicine

## 2020-02-02 ENCOUNTER — Ambulatory Visit (INDEPENDENT_AMBULATORY_CARE_PROVIDER_SITE_OTHER): Payer: No Typology Code available for payment source | Admitting: Internal Medicine

## 2020-02-02 VITALS — BP 128/72 | HR 63 | Temp 97.9°F | Wt 185.0 lb

## 2020-02-02 DIAGNOSIS — N400 Enlarged prostate without lower urinary tract symptoms: Secondary | ICD-10-CM | POA: Insufficient documentation

## 2020-02-02 DIAGNOSIS — I10 Essential (primary) hypertension: Secondary | ICD-10-CM | POA: Diagnosis not present

## 2020-02-02 DIAGNOSIS — R252 Cramp and spasm: Secondary | ICD-10-CM | POA: Diagnosis not present

## 2020-02-02 DIAGNOSIS — Z0000 Encounter for general adult medical examination without abnormal findings: Secondary | ICD-10-CM | POA: Diagnosis not present

## 2020-02-02 DIAGNOSIS — R350 Frequency of micturition: Secondary | ICD-10-CM | POA: Diagnosis not present

## 2020-02-02 DIAGNOSIS — N320 Bladder-neck obstruction: Secondary | ICD-10-CM | POA: Diagnosis not present

## 2020-02-02 DIAGNOSIS — E785 Hyperlipidemia, unspecified: Secondary | ICD-10-CM

## 2020-02-02 DIAGNOSIS — N401 Enlarged prostate with lower urinary tract symptoms: Secondary | ICD-10-CM | POA: Diagnosis not present

## 2020-02-02 DIAGNOSIS — Z0001 Encounter for general adult medical examination with abnormal findings: Secondary | ICD-10-CM

## 2020-02-02 MED ORDER — TADALAFIL 5 MG PO TABS: 5.0000 mg | ORAL_TABLET | Freq: Every day | ORAL | 11 refills | Status: DC

## 2020-02-02 MED ORDER — TIZANIDINE HCL 2 MG PO CAPS: 2.0000 mg | ORAL_CAPSULE | Freq: Every day | ORAL | 5 refills | Status: DC

## 2020-02-02 NOTE — Assessment & Plan Note (Addendum)
Worse  Cialis 5 mg daily

## 2020-02-02 NOTE — Assessment & Plan Note (Signed)
BP Readings from Last 3 Encounters:  02/02/20 128/72  05/05/19 132/84  09/08/18 124/76

## 2020-02-02 NOTE — Assessment & Plan Note (Addendum)
RLE -- R leg pain, cramps every night - waking up. No pain in the daytime Zanaflex at hs

## 2020-02-02 NOTE — Assessment & Plan Note (Addendum)
  We discussed age appropriate health related issues, including available/recomended screening tests and vaccinations. Labs were ordered to be later reviewed . All questions were answered. We discussed one or more of the following - seat belt use, use of sunscreen/sun exposure exercise, safe sex, fall risk reduction, second hand smoke exposure, firearm use and storage, seat belt use, a need for adhering to healthy diet and exercise. Labs were ordered.  All questions were answered.  cardiac CT scan for calcium scoring offered 6.20 cologuard due 2021

## 2020-02-02 NOTE — Progress Notes (Signed)
Subjective:  Patient ID: Austin Aguilar, male    DOB: 12/20/1955  Age: 64 y.o. MRN: [id number]  CC: Annual Exam   HPI Austin Aguilar presents for a well exam C/o R leg pain, cramps every night - waking up. No pain in the daytime  Outpatient Medications Prior to Visit  Medication Sig Dispense Refill  . Cholecalciferol 1000 units tablet Take 1,000 Units by mouth daily.    . Omega-3 Fatty Acids (FISH OIL) 1200 MG CAPS Take 1 capsule by mouth daily.    . Ascorbic Acid (VITAMIN C) 1000 MG tablet Take 1,000 mg by mouth daily.     Marland Kitchen amLODipine (NORVASC) 5 MG tablet TAKE 1 TABLET BY MOUTH EVERY DAY (Patient not taking: Reported on 02/02/2020) 90 tablet 3  . Cholecalciferol (VITAMIN D3) 1.25 MG (50000 UT) CAPS Take 1 capsule by mouth once a week. (Patient not taking: Reported on 02/02/2020) 6 capsule 0  . ibuprofen (ADVIL,MOTRIN) 600 MG tablet Take 1 tablet (600 mg total) by mouth every 8 (eight) hours as needed for moderate pain. (Patient not taking: Reported on 02/02/2020) 60 tablet 1  . losartan (COZAAR) 100 MG tablet TAKE 1 TABLET BY MOUTH EVERY DAY (Patient not taking: Reported on 02/02/2020) 90 tablet 3  . methylPREDNISolone (MEDROL DOSEPAK) 4 MG TBPK tablet As directed (Patient not taking: Reported on 02/02/2020) 21 tablet 0  . potassium chloride (MICRO-K) 10 MEQ CR capsule TAKE 1 CAPSULE (10 MEQ TOTAL) BY MOUTH 2 (TWO) TIMES DAILY. (Patient not taking: Reported on 02/02/2020) 180 capsule 1  . traMADol (ULTRAM) 50 MG tablet Take 1-2 tablets (50-100 mg total) by mouth 2 (two) times daily as needed. (Patient not taking: Reported on 02/02/2020) 60 tablet 0   No facility-administered medications prior to visit.    ROS: Review of Systems  Constitutional: Negative for appetite change, fatigue and unexpected weight change.  HENT: Negative for congestion, nosebleeds, sneezing, sore throat and trouble swallowing.   Eyes: Negative for itching and visual disturbance.  Respiratory: Negative  for cough.   Cardiovascular: Negative for chest pain, palpitations and leg swelling.  Gastrointestinal: Negative for abdominal distention, blood in stool, diarrhea and nausea.  Genitourinary: Positive for frequency and urgency. Negative for hematuria.  Musculoskeletal: Positive for myalgias. Negative for back pain, gait problem, joint swelling and neck pain.  Skin: Negative for rash.  Neurological: Negative for dizziness, tremors, speech difficulty and weakness.  Psychiatric/Behavioral: Negative for agitation, dysphoric mood and sleep disturbance. The patient is not nervous/anxious.     Objective:  BP 128/72 (BP Location: Left Arm)   Pulse 63   Temp 97.9 F (36.6 C) (Oral)   Wt 185 lb (83.9 kg)   SpO2 97%   BMI 28.13 kg/m   BP Readings from Last 3 Encounters:  02/02/20 128/72  05/05/19 132/84  09/08/18 124/76    Wt Readings from Last 3 Encounters:  02/02/20 185 lb (83.9 kg)  05/05/19 181 lb (82.1 kg)  09/08/18 180 lb (81.6 kg)    Physical Exam Constitutional:      General: He is not in acute distress.    Appearance: He is well-developed.     Comments: NAD  Eyes:     Conjunctiva/sclera: Conjunctivae normal.     Pupils: Pupils are equal, round, and reactive to light.  Neck:     Thyroid: No thyromegaly.     Vascular: No JVD.  Cardiovascular:     Rate and Rhythm: Normal rate and regular rhythm.  Heart sounds: Normal heart sounds. No murmur heard.  No friction rub. No gallop.   Pulmonary:     Effort: Pulmonary effort is normal. No respiratory distress.     Breath sounds: Normal breath sounds. No wheezing or rales.  Chest:     Chest wall: No tenderness.  Abdominal:     General: Bowel sounds are normal. There is no distension.     Palpations: Abdomen is soft. There is no mass.     Tenderness: There is no abdominal tenderness. There is no guarding or rebound.  Genitourinary:    Prostate: Normal.     Rectum: Normal. Guaiac result negative.  Musculoskeletal:         General: No tenderness. Normal range of motion.     Cervical back: Normal range of motion.  Lymphadenopathy:     Cervical: No cervical adenopathy.  Skin:    General: Skin is warm and dry.     Findings: No rash.  Neurological:     Mental Status: He is alert and oriented to person, place, and time.     Cranial Nerves: No cranial nerve deficit.     Motor: No abnormal muscle tone.     Coordination: Coordination normal.     Gait: Gait normal.     Deep Tendon Reflexes: Reflexes are normal and symmetric.  Psychiatric:        Behavior: Behavior normal.        Thought Content: Thought content normal.        Judgment: Judgment normal.    I spent 22 minutes in addition to time for CPX wellness examination in preparing to see the patient by review of recent labs, imaging and procedures, obtaining and reviewing separately obtained history, communicating with the patient, ordering medications, tests or procedures, and documenting clinical information in the EHR including the differential diagnosis, treatment, and any further evaluation and other management of BPH, cramps, HTN         Lab Results  Component Value Date   WBC 6.6 09/08/2018   HGB 13.9 09/08/2018   HCT 40.8 09/08/2018   PLT 216.0 09/08/2018   GLUCOSE 89 09/08/2018   CHOL 156 09/08/2018   TRIG 81.0 09/08/2018   HDL 43.10 09/08/2018   LDLDIRECT 114.0 08/06/2016   LDLCALC 96 09/08/2018   ALT 17 09/08/2018   AST 19 09/08/2018   NA 140 09/08/2018   K 3.9 09/08/2018   CL 104 09/08/2018   CREATININE 0.75 09/08/2018   BUN 13 09/08/2018   CO2 29 09/08/2018   TSH 3.13 09/08/2018   PSA 0.69 09/08/2018   HGBA1C 5.1 02/10/2017    No results found.  Assessment & Plan:    Walker Kehr, MD

## 2020-02-03 LAB — CBC WITH DIFFERENTIAL/PLATELET
Basophils Absolute: 0 10*3/uL (ref 0.0–0.1)
Basophils Relative: 0.6 % (ref 0.0–3.0)
Eosinophils Absolute: 0.1 10*3/uL (ref 0.0–0.7)
Eosinophils Relative: 0.8 % (ref 0.0–5.0)
HCT: 40.3 % (ref 39.0–52.0)
Hemoglobin: 13.9 g/dL (ref 13.0–17.0)
Lymphocytes Relative: 26.4 % (ref 12.0–46.0)
Lymphs Abs: 1.7 10*3/uL (ref 0.7–4.0)
MCHC: 34.5 g/dL (ref 30.0–36.0)
MCV: 86.9 fl (ref 78.0–100.0)
Monocytes Absolute: 0.4 10*3/uL (ref 0.1–1.0)
Monocytes Relative: 6.4 % (ref 3.0–12.0)
Neutro Abs: 4.3 10*3/uL (ref 1.4–7.7)
Neutrophils Relative %: 65.8 % (ref 43.0–77.0)
Platelets: 228 10*3/uL (ref 150.0–400.0)
RBC: 4.65 Mil/uL (ref 4.22–5.81)
RDW: 12.8 % (ref 11.5–15.5)
WBC: 6.5 10*3/uL (ref 4.0–10.5)

## 2020-02-03 LAB — LIPID PANEL
Cholesterol: 152 mg/dL (ref 0–200)
HDL: 40 mg/dL (ref >39.00)
LDL Cholesterol: 87 mg/dL (ref 0–99)
NonHDL: 112.25
Total CHOL/HDL Ratio: 4
Triglycerides: 125 mg/dL (ref 0.0–149.0)
VLDL: 25 mg/dL (ref 0.0–40.0)

## 2020-02-03 LAB — COMPREHENSIVE METABOLIC PANEL
ALT: 16 U/L (ref 0–53)
AST: 19 U/L (ref 0–37)
Albumin: 4.6 g/dL (ref 3.5–5.2)
Alkaline Phosphatase: 67 U/L (ref 39–117)
BUN: 15 mg/dL (ref 6–23)
CO2: 29 mEq/L (ref 19–32)
Calcium: 9.5 mg/dL (ref 8.4–10.5)
Chloride: 106 mEq/L (ref 96–112)
Creatinine, Ser: 1.08 mg/dL (ref 0.40–1.50)
GFR: 72.88 mL/min (ref >60.00)
Glucose, Bld: 85 mg/dL (ref 70–99)
Potassium: 3.8 mEq/L (ref 3.5–5.1)
Sodium: 141 mEq/L (ref 135–145)
Total Bilirubin: 0.6 mg/dL (ref 0.2–1.2)
Total Protein: 6.5 g/dL (ref 6.0–8.3)

## 2020-02-03 LAB — URINALYSIS
Bilirubin Urine: NEGATIVE
Hgb urine dipstick: NEGATIVE
Ketones, ur: NEGATIVE
Leukocytes,Ua: NEGATIVE
Nitrite: NEGATIVE
Specific Gravity, Urine: 1.02 (ref 1.000–1.030)
Total Protein, Urine: NEGATIVE
Urine Glucose: NEGATIVE
Urobilinogen, UA: 1 (ref 0.0–1.0)
pH: 7 (ref 5.0–8.0)

## 2020-02-03 LAB — PSA: PSA: 0.66 ng/mL (ref 0.10–4.00)

## 2020-02-03 LAB — TSH: TSH: 1.34 u[IU]/mL (ref 0.35–4.50)

## 2020-02-07 DIAGNOSIS — N320 Bladder-neck obstruction: Secondary | ICD-10-CM | POA: Insufficient documentation

## 2020-02-07 DIAGNOSIS — E785 Hyperlipidemia, unspecified: Secondary | ICD-10-CM | POA: Insufficient documentation

## 2020-02-07 NOTE — Assessment & Plan Note (Signed)
See dx

## 2020-02-18 ENCOUNTER — Telehealth: Payer: Self-pay | Admitting: *Deleted

## 2020-02-18 NOTE — Telephone Encounter (Signed)
Rec'd PA for Tadalafil 5 mg. Completed vis cover-my-meds w/ (Key: [id number]). Rec'd msg stating " Your information has been submitted to [employer]. To check for an updated outcome later, reopen this PA request from your dashboard.Marland KitchenJohny Chess

## 2020-02-21 NOTE — Telephone Encounter (Signed)
Rec'd fax " Notice of Approval" for pt Tadalafil 5 mg. Med has ben approved for time period 02/18/20 - 02/17/21. Pt is also aware of approval../lmb

## 2020-07-22 IMAGING — DX DG LUMBAR SPINE 2-3V
3 series · 3 of 3 positions shown · non-contrast
Comparison: None.

CLINICAL DATA: Low back pain radiating into the legs bilaterally

EXAM:
LUMBAR SPINE - 3 VIEW

[lumbar spine ap]
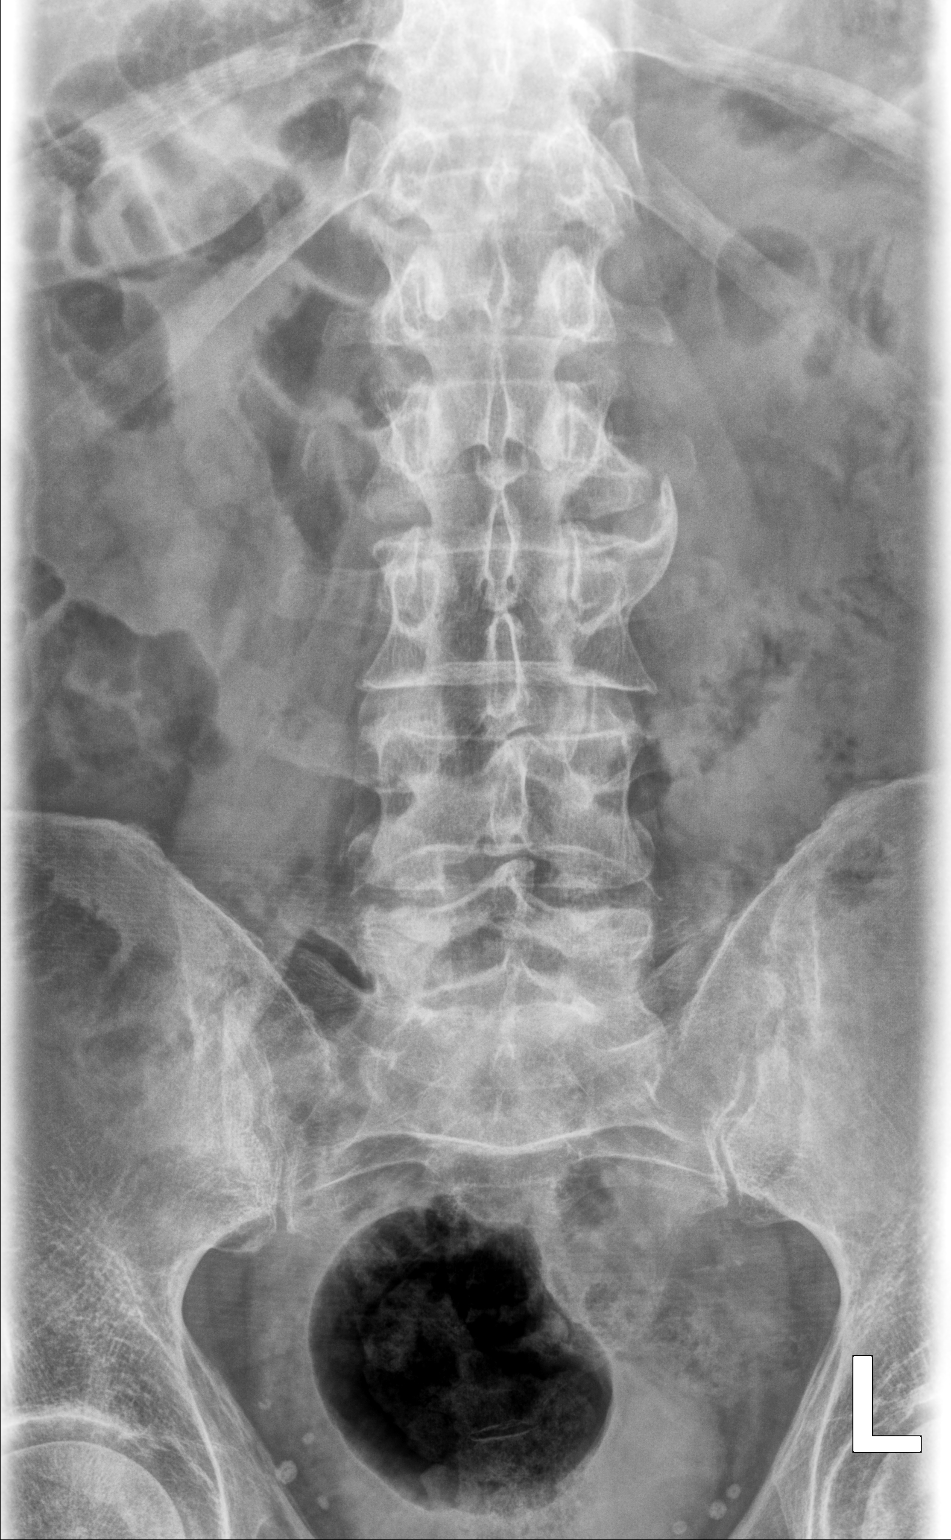

[lumbar spine lat (1 of 2)]
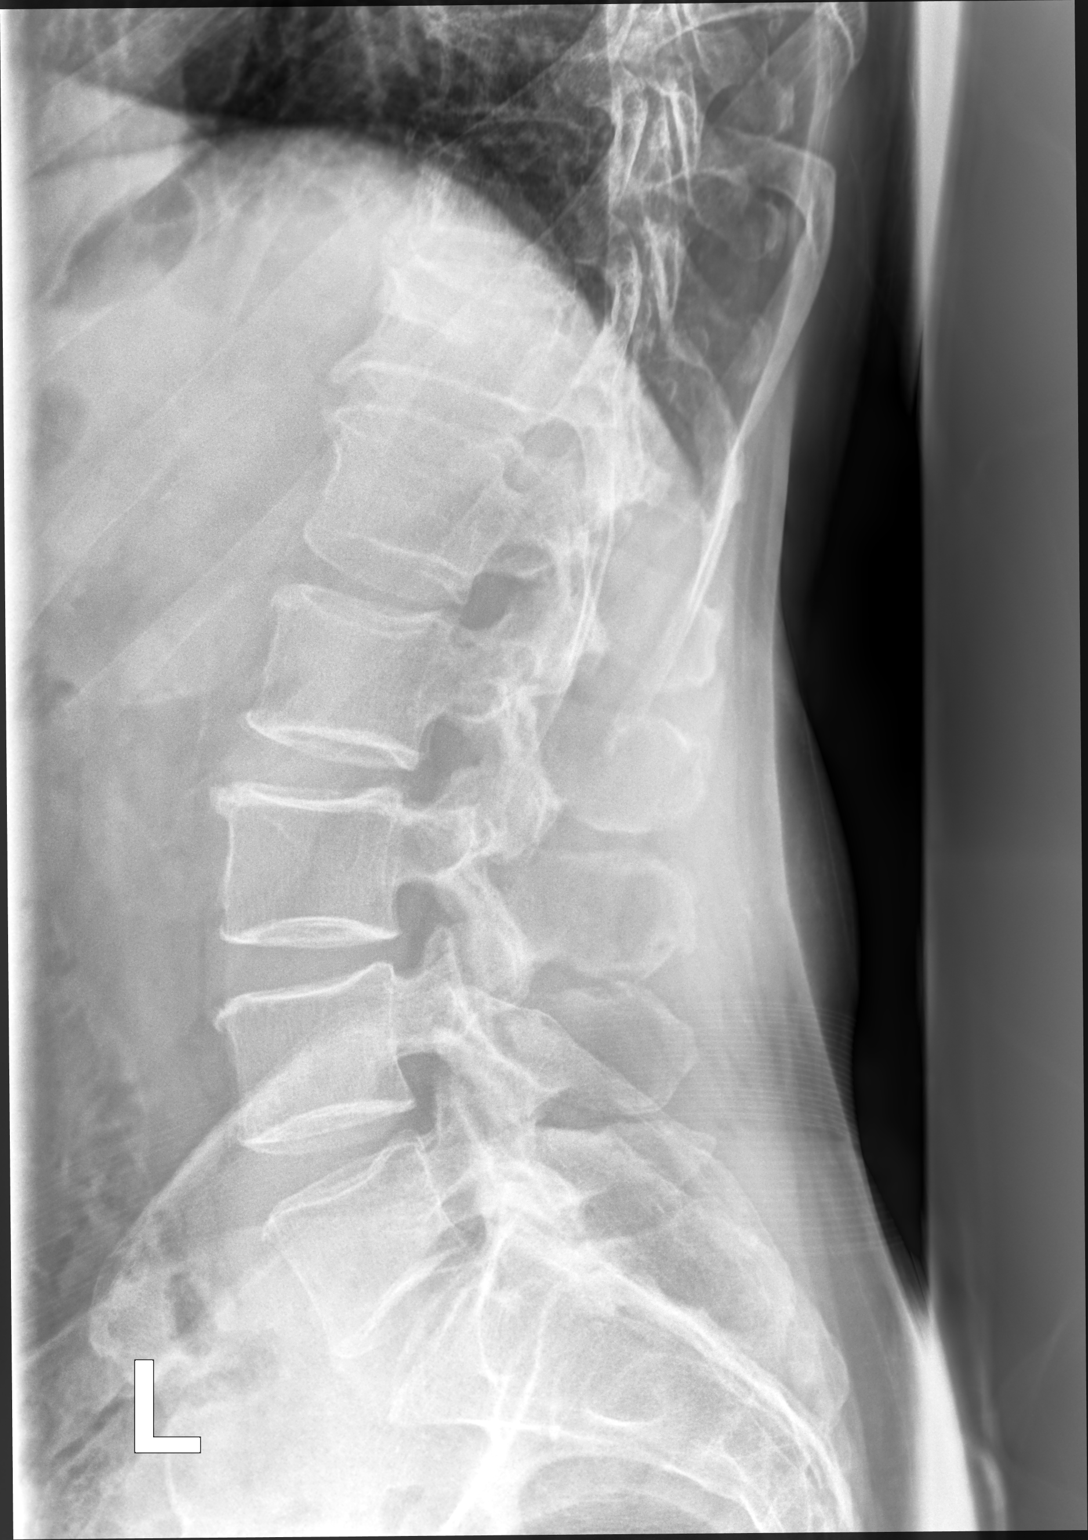

[lumbar spine lat (2 of 2)]
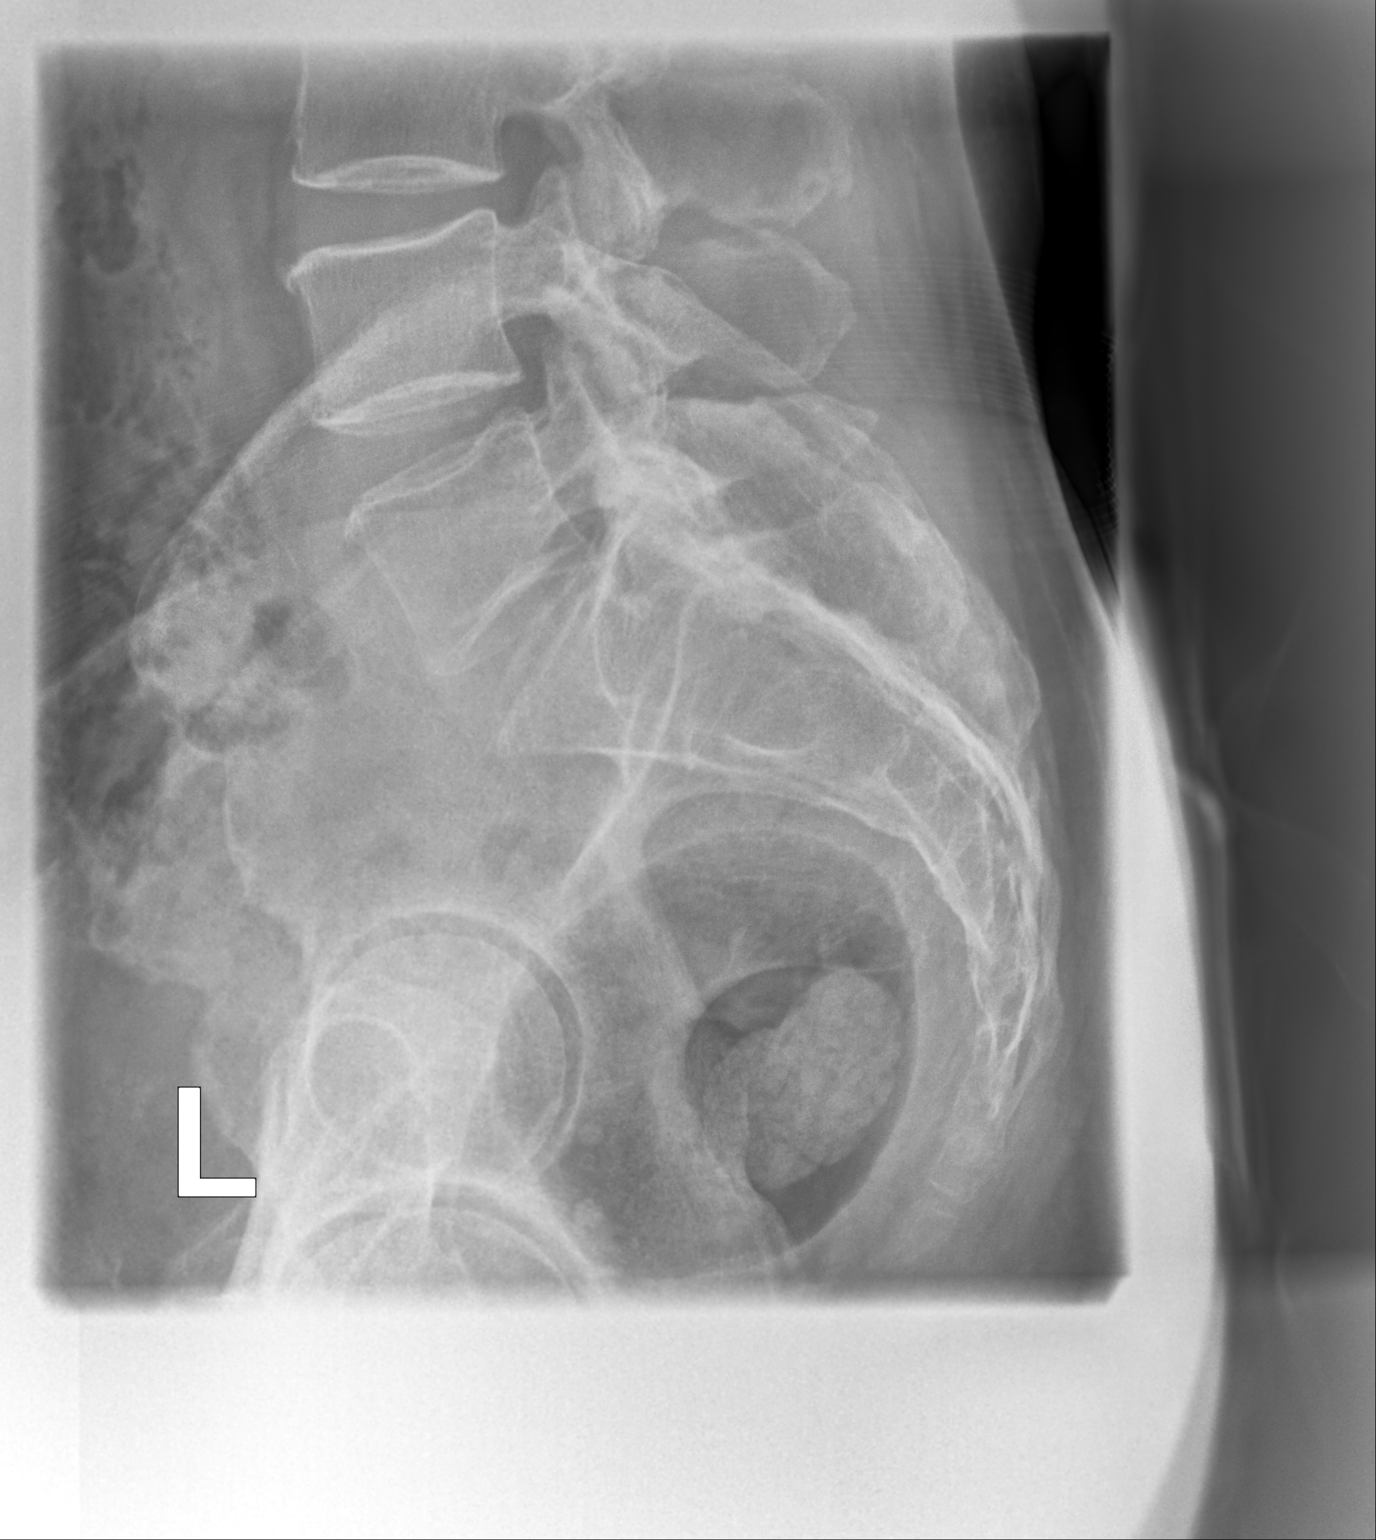

[3 of 3 positions shown; findings below may reference images not displayed]

FINDINGS: Five lumbar type vertebral bodies are well visualized. Vertebral
body height is well maintained. Osteophytic changes are seen. No
compression deformities are noted. No significant anterolisthesis is
noted. No soft tissue changes are seen.
IMPRESSION: Mild degenerative change without acute abnormality.

## 2020-07-29 ENCOUNTER — Other Ambulatory Visit: Payer: Self-pay | Admitting: Internal Medicine

## 2021-01-10 ENCOUNTER — Other Ambulatory Visit: Payer: Self-pay | Admitting: Internal Medicine

## 2021-01-24 ENCOUNTER — Other Ambulatory Visit: Payer: Self-pay | Admitting: Internal Medicine

## 2022-03-25 ENCOUNTER — Ambulatory Visit (INDEPENDENT_AMBULATORY_CARE_PROVIDER_SITE_OTHER): Payer: Medicare HMO | Admitting: Internal Medicine

## 2022-03-25 ENCOUNTER — Encounter: Payer: Self-pay | Admitting: Internal Medicine

## 2022-03-25 VITALS — BP 120/70 | HR 70 | Temp 98.1°F | Ht 68.0 in | Wt 193.0 lb

## 2022-03-25 DIAGNOSIS — H9313 Tinnitus, bilateral: Secondary | ICD-10-CM | POA: Diagnosis not present

## 2022-03-25 DIAGNOSIS — E785 Hyperlipidemia, unspecified: Secondary | ICD-10-CM

## 2022-03-25 DIAGNOSIS — N320 Bladder-neck obstruction: Secondary | ICD-10-CM

## 2022-03-25 DIAGNOSIS — Z1211 Encounter for screening for malignant neoplasm of colon: Secondary | ICD-10-CM

## 2022-03-25 DIAGNOSIS — Z0000 Encounter for general adult medical examination without abnormal findings: Secondary | ICD-10-CM | POA: Diagnosis not present

## 2022-03-25 LAB — COMPREHENSIVE METABOLIC PANEL
ALT: 16 U/L (ref 0–53)
AST: 16 U/L (ref 0–37)
Albumin: 4.9 g/dL (ref 3.5–5.2)
Alkaline Phosphatase: 67 U/L (ref 39–117)
BUN: 18 mg/dL (ref 6–23)
CO2: 28 mEq/L (ref 19–32)
Calcium: 10.3 mg/dL (ref 8.4–10.5)
Chloride: 106 mEq/L (ref 96–112)
Creatinine, Ser: 0.9 mg/dL (ref 0.40–1.50)
GFR: 89.35 mL/min (ref >60.00)
Glucose, Bld: 79 mg/dL (ref 70–99)
Potassium: 4.7 mEq/L (ref 3.5–5.1)
Sodium: 141 mEq/L (ref 135–145)
Total Bilirubin: 0.6 mg/dL (ref 0.2–1.2)
Total Protein: 7.2 g/dL (ref 6.0–8.3)

## 2022-03-25 LAB — URINALYSIS
Bilirubin Urine: NEGATIVE
Hgb urine dipstick: NEGATIVE
Ketones, ur: NEGATIVE
Leukocytes,Ua: NEGATIVE
Nitrite: NEGATIVE
Specific Gravity, Urine: 1.015 (ref 1.000–1.030)
Total Protein, Urine: NEGATIVE
Urine Glucose: NEGATIVE
Urobilinogen, UA: 0.2 (ref 0.0–1.0)
pH: 6 (ref 5.0–8.0)

## 2022-03-25 LAB — CBC WITH DIFFERENTIAL/PLATELET
Basophils Absolute: 0 10*3/uL (ref 0.0–0.1)
Basophils Relative: 0.9 % (ref 0.0–3.0)
Eosinophils Absolute: 0 10*3/uL (ref 0.0–0.7)
Eosinophils Relative: 0.7 % (ref 0.0–5.0)
HCT: 45.2 % (ref 39.0–52.0)
Hemoglobin: 15.4 g/dL (ref 13.0–17.0)
Lymphocytes Relative: 24.4 % (ref 12.0–46.0)
Lymphs Abs: 1.4 10*3/uL (ref 0.7–4.0)
MCHC: 34.2 g/dL (ref 30.0–36.0)
MCV: 93.1 fl (ref 78.0–100.0)
Monocytes Absolute: 0.4 10*3/uL (ref 0.1–1.0)
Monocytes Relative: 6.9 % (ref 3.0–12.0)
Neutro Abs: 3.8 10*3/uL (ref 1.4–7.7)
Neutrophils Relative %: 67.1 % (ref 43.0–77.0)
Platelets: 262 10*3/uL (ref 150.0–400.0)
RBC: 4.85 Mil/uL (ref 4.22–5.81)
RDW: 12.7 % (ref 11.5–15.5)
WBC: 5.7 10*3/uL (ref 4.0–10.5)

## 2022-03-25 LAB — PSA: PSA: 0.71 ng/mL (ref 0.10–4.00)

## 2022-03-25 LAB — LIPID PANEL
Cholesterol: 153 mg/dL (ref 0–200)
HDL: 39.4 mg/dL (ref >39.00)
LDL Cholesterol: 96 mg/dL (ref 0–99)
NonHDL: 113.94
Total CHOL/HDL Ratio: 4
Triglycerides: 88 mg/dL (ref 0.0–149.0)
VLDL: 17.6 mg/dL (ref 0.0–40.0)

## 2022-03-25 LAB — TSH: TSH: 2.64 u[IU]/mL (ref 0.35–5.50)

## 2022-03-25 MED ORDER — TAMSULOSIN HCL 0.4 MG PO CAPS: 0.4000 mg | ORAL_CAPSULE | Freq: Every day | ORAL | 11 refills | Status: DC

## 2022-03-25 NOTE — Assessment & Plan Note (Addendum)
  We discussed age appropriate health related issues, including available/recomended screening tests and vaccinations. Labs were ordered to be later reviewed . All questions were answered. We discussed one or more of the following - seat belt use, use of sunscreen/sun exposure exercise, fall risk reduction, second hand smoke exposure, firearm use and storage, seat belt use, a need for adhering to healthy diet and exercise.  Labs were ordered.  All questions were answered. cardiac CT scan for calcium scoring ordered cologuard due

## 2022-03-25 NOTE — Progress Notes (Signed)
Subjective:  Patient ID: Austin Aguilar, male    DOB: 1955/10/08  Age: 67 y.o. MRN: [id number]  CC: Annual Exam   HPI Austin Aguilar presents for a well exam C/o tinnitus x 1 year   Outpatient Medications Prior to Visit  Medication Sig Dispense Refill   Ascorbic Acid (VITAMIN C) 1000 MG tablet Take 1,000 mg by mouth daily.      Cholecalciferol 1000 units tablet Take 1,000 Units by mouth daily.     Omega-3 Fatty Acids (FISH OIL) 1200 MG CAPS Take 1 capsule by mouth daily.     tadalafil (CIALIS) 5 MG tablet Take 1 tablet (5 mg total) by mouth daily. 30 tablet 11   tizanidine (ZANAFLEX) 2 MG capsule TAKE 1-2 CAPSULES BY MOUTH AT BEDTIME. NEED APPT FOR REFILLS 180 capsule 1   No facility-administered medications prior to visit.    ROS: Review of Systems  Constitutional:  Negative for appetite change, fatigue and unexpected weight change.  HENT:  Positive for tinnitus. Negative for congestion, hearing loss, nosebleeds, sneezing, sore throat and trouble swallowing.   Eyes:  Negative for itching and visual disturbance.  Respiratory:  Negative for cough.   Cardiovascular:  Negative for chest pain, palpitations and leg swelling.  Gastrointestinal:  Negative for abdominal distention, blood in stool, diarrhea and nausea.  Genitourinary:  Negative for frequency and hematuria.  Musculoskeletal:  Negative for back pain, gait problem, joint swelling and neck pain.  Skin:  Negative for rash.  Neurological:  Negative for dizziness, tremors, speech difficulty and weakness.  Psychiatric/Behavioral:  Negative for agitation, dysphoric mood, sleep disturbance and suicidal ideas. The patient is not nervous/anxious.     Objective:  BP 120/70 (BP Location: Right Arm, Patient Position: Sitting, Cuff Size: Normal)   Pulse 70   Temp 98.1 F (36.7 C) (Oral)   Ht '5\' 8"'$  (1.727 m)   Wt 193 lb (87.5 kg)   SpO2 98%   BMI 29.35 kg/m   BP Readings from Last 3 Encounters:  03/25/22 120/70   02/02/20 128/72  05/05/19 132/84    Wt Readings from Last 3 Encounters:  03/25/22 193 lb (87.5 kg)  02/02/20 185 lb (83.9 kg)  05/05/19 181 lb (82.1 kg)    Physical Exam Constitutional:      General: He is not in acute distress.    Appearance: Normal appearance. He is well-developed.     Comments: NAD  Eyes:     Conjunctiva/sclera: Conjunctivae normal.     Pupils: Pupils are equal, round, and reactive to light.  Neck:     Thyroid: No thyromegaly.     Vascular: No carotid bruit or JVD.  Cardiovascular:     Rate and Rhythm: Normal rate and regular rhythm.     Heart sounds: Normal heart sounds. No murmur heard.    No friction rub. No gallop.  Pulmonary:     Effort: Pulmonary effort is normal. No respiratory distress.     Breath sounds: Normal breath sounds. No wheezing or rales.  Chest:     Chest wall: No tenderness.  Abdominal:     General: Bowel sounds are normal. There is no distension.     Palpations: Abdomen is soft. There is no mass.     Tenderness: There is no abdominal tenderness. There is no guarding or rebound.  Genitourinary:    Prostate: Normal.     Rectum: Normal. Guaiac result negative.  Musculoskeletal:        General: No tenderness. Normal  range of motion.     Cervical back: Normal range of motion.  Lymphadenopathy:     Cervical: No cervical adenopathy.  Skin:    General: Skin is warm and dry.     Findings: No rash.  Neurological:     Mental Status: He is alert and oriented to person, place, and time.     Cranial Nerves: No cranial nerve deficit.     Motor: No abnormal muscle tone.     Coordination: Coordination normal.     Gait: Gait normal.     Deep Tendon Reflexes: Reflexes are normal and symmetric.  Psychiatric:        Behavior: Behavior normal.        Thought Content: Thought content normal.        Judgment: Judgment normal.     Lab Results  Component Value Date   WBC 6.5 02/02/2020   HGB 13.9 02/02/2020   HCT 40.3 02/02/2020   PLT  228.0 02/02/2020   GLUCOSE 85 02/02/2020   CHOL 152 02/02/2020   TRIG 125.0 02/02/2020   HDL 40.00 02/02/2020   LDLDIRECT 114.0 08/06/2016   LDLCALC 87 02/02/2020   ALT 16 02/02/2020   AST 19 02/02/2020   NA 141 02/02/2020   K 3.8 02/02/2020   CL 106 02/02/2020   CREATININE 1.08 02/02/2020   BUN 15 02/02/2020   CO2 29 02/02/2020   TSH 1.34 02/02/2020   PSA 0.66 02/02/2020   HGBA1C 5.1 02/10/2017    No results found.  Assessment & Plan:   Problem List Items Addressed This Visit       Genitourinary   Bladder neck obstruction    Start Flomax      Relevant Orders   PSA     Other   Well adult exam - Primary     We discussed age appropriate health related issues, including available/recomended screening tests and vaccinations. Labs were ordered to be later reviewed . All questions were answered. We discussed one or more of the following - seat belt use, use of sunscreen/sun exposure exercise, fall risk reduction, second hand smoke exposure, firearm use and storage, seat belt use, a need for adhering to healthy diet and exercise.  Labs were ordered.  All questions were answered. cardiac CT scan for calcium scoring ordered cologuard due      Relevant Medications   tamsulosin (FLOMAX) 0.4 MG CAPS capsule   Other Relevant Orders   TSH   Urinalysis   CBC with Differential/Platelet   Lipid panel   PSA   Comprehensive metabolic panel   Tinnitus of both ears    New. Discussed. ENT ref      Relevant Orders   Ambulatory referral to ENT   Dyslipidemia    Coronary calcium CT ordered      Relevant Orders   TSH   Lipid panel   CT CARDIAC SCORING (SELF PAY ONLY)   Colon cancer screening   Relevant Orders   Cologuard      Meds ordered this encounter  Medications   tamsulosin (FLOMAX) 0.4 MG CAPS capsule    Sig: Take 1 capsule (0.4 mg total) by mouth daily after supper.    Dispense:  30 capsule    Refill:  11      Follow-up: Return in about 3 months (around  06/24/2022) for a follow-up visit.  Walker Kehr, MD

## 2022-03-25 NOTE — Assessment & Plan Note (Signed)
Start Flomax

## 2022-03-25 NOTE — Patient Instructions (Signed)

## 2022-03-25 NOTE — Assessment & Plan Note (Signed)
New. Discussed. ENT ref

## 2022-03-25 NOTE — Assessment & Plan Note (Addendum)
Coronary calcium CT ordered

## 2022-03-29 ENCOUNTER — Other Ambulatory Visit: Payer: No Typology Code available for payment source

## 2022-04-24 ENCOUNTER — Other Ambulatory Visit: Payer: No Typology Code available for payment source

## 2022-04-26 ENCOUNTER — Ambulatory Visit
Admission: RE | Admit: 2022-04-26 | Discharge: 2022-04-26 | Disposition: A | Discharge status: Home / Self Care | Payer: No Typology Code available for payment source | Source: Ambulatory Visit | Attending: Internal Medicine | Admitting: Internal Medicine

## 2022-04-26 DIAGNOSIS — E785 Hyperlipidemia, unspecified: Secondary | ICD-10-CM

## 2022-04-28 ENCOUNTER — Other Ambulatory Visit: Payer: Self-pay | Admitting: Internal Medicine

## 2022-04-28 ENCOUNTER — Encounter: Payer: Self-pay | Admitting: Internal Medicine

## 2022-04-28 DIAGNOSIS — I2510 Atherosclerotic heart disease of native coronary artery without angina pectoris: Secondary | ICD-10-CM

## 2022-04-28 DIAGNOSIS — R9389 Abnormal findings on diagnostic imaging of other specified body structures: Secondary | ICD-10-CM | POA: Insufficient documentation

## 2022-04-28 MED ORDER — ASPIRIN 81 MG PO TBEC: 81.0000 mg | DELAYED_RELEASE_TABLET | Freq: Every day | ORAL | 3 refills | Status: AC

## 2022-04-28 MED ORDER — ROSUVASTATIN CALCIUM 5 MG PO TABS: 5.0000 mg | ORAL_TABLET | Freq: Every day | ORAL | 3 refills | Status: DC

## 2022-05-06 ENCOUNTER — Telehealth: Payer: Self-pay

## 2022-05-06 NOTE — Progress Notes (Signed)
Referring- Walker Kehr MD Reason for referral-coronary calcification  HPI: 67 year old male for evaluation of coronary calcification at request of Walker Kehr MD.  Calcium score February 2024 showed 3 vessel coronary artery calcification and a total score of 727 which was 86 percentile.  Patient denies dyspnea, chest pain, palpitations or syncope.   Current Outpatient Medications  Medication Sig Dispense Refill   Ascorbic Acid (VITAMIN C) 1000 MG tablet Take 1,000 mg by mouth daily.      aspirin EC 81 MG tablet Take 1 tablet (81 mg total) by mouth daily. 100 tablet 3   Cholecalciferol 1000 units tablet Take 1,000 Units by mouth daily.     Omega-3 Fatty Acids (FISH OIL) 1200 MG CAPS Take 1 capsule by mouth daily.     rosuvastatin (CRESTOR) 5 MG tablet Take 1 tablet (5 mg total) by mouth daily. 30 tablet 3   tamsulosin (FLOMAX) 0.4 MG CAPS capsule Take 1 capsule (0.4 mg total) by mouth daily after supper. 30 capsule 11   No current facility-administered medications for this visit.    No Active Allergies   Past Medical History:  Diagnosis Date   Anxiety    Arthritis    hands   BPH (benign prostatic hyperplasia)    Depression    denies at present- states resolved- changed jobs   Diverticulosis of colon    Grief 08/27/15   wife passed away   HTN (hypertension) 03/18/2009   12/10  stress  test Dr Johnsie Cancel EPIC   Hydrocele     Past Surgical History:  Procedure Laterality Date   HERNIA REPAIR  1978   [facility]   HYDROCELE EXCISION  04/02/2012   Procedure: HYDROCELECTOMY ADULT;  Surgeon: Malka So, MD;  Location: [facility];  Service: Urology;  Laterality: Right;   INGUINAL HERNIA REPAIR  11/21/2011   Procedure: LAPAROSCOPIC INGUINAL HERNIA;  Surgeon: Adin Hector, MD;  Location: [facility];  Service: General;  Laterality: Bilateral;  BILATERAL INGUINAL HERNIA REPAIRS LAPARASCOPIC   SHOULDER ARTHROSCOPY  01/01/11   VENTRAL HERNIA REPAIR  11/21/2011    Procedure: LAPAROSCOPIC VENTRAL HERNIA;  Surgeon: Adin Hector, MD;  Location: [facility];  Service: General;  Laterality: N/A;  laparoscopic ventral wall hernia repair    Social History   Socioeconomic History   Marital status: Widowed    Spouse name: Not on file   Number of children: 3   Years of education: Not on file   Highest education level: Not on file  Occupational History   Occupation: Landscape architect: [employer]    Comment: [employer]/50-60 hr wk week  Tobacco Use   Smoking status: Former    Types: Cigarettes, Pipe, Cigars    Quit date: 11/18/1988    Years since quitting: 33.5   Smokeless tobacco: Never   Tobacco comments:    quit 1990's/2nd hand exposure from wife (min)  Substance and Sexual Activity   Alcohol use: Never    Alcohol/week: 3.0 standard drinks of alcohol    Types: 3 Cans of beer per week   Drug use: No   Sexual activity: Yes  Other Topics Concern   Not on file  Social History Narrative   Regular exercise - YES, running            Social Determinants of Health   Financial Resource Strain: Not on file  Food Insecurity: Not on file  Transportation Needs: Not on file  Physical Activity: Not on  file  Stress: Not on file  Social Connections: Not on file  Intimate Partner Violence: Not on file    Family History  Problem Relation Age of Onset   Hypertension Mother    Hyperlipidemia Mother    Stroke Mother    Coronary artery disease Father        ?CABG    Alcohol abuse Sister    Drug abuse Sister    Coronary artery disease Brother    Alcohol abuse Brother    Drug abuse Brother    COPD Brother    Hypertension Brother    Heart disease Brother     ROS: no fevers or chills, productive cough, hemoptysis, dysphasia, odynophagia, melena, hematochezia, dysuria, hematuria, rash, seizure activity, orthopnea, PND, pedal edema, claudication. Remaining systems are negative.  Physical Exam:   Blood pressure 130/70, pulse 68, height '5\' 8"'$   (1.727 m), weight 183 lb (83 kg), SpO2 97 %.  General:  Well developed/well nourished in NAD Skin warm/dry Patient not depressed No peripheral clubbing Back-normal HEENT-normal/normal eyelids Neck supple/normal carotid upstroke bilaterally; no bruits; no JVD; no thyromegaly chest - CTA/ normal expansion CV - RRR/normal S1 and S2; no murmurs, rubs or gallops;  PMI nondisplaced Abdomen -NT/ND, no HSM, no mass, + bowel sounds, positive bruit 2+ femoral pulses, no bruits Ext-no edema, chords, 2+ DP Neuro-grossly nonfocal  ECG -normal sinus rhythm at a rate of 68, no ST changes.  Personally reviewed  A/P  1 coronary calcification-patient denies chest pain.  I will arrange a stress nuclear study to screen for ischemia.  Treat with aspirin 81 mg daily and continue statin.  2 hyperlipidemia-given documented coronary disease will increase Crestor to 40 mg daily.  Check lipids and liver in 8 weeks.  3 hypertension-patient's blood pressure is controlled.  Continue present medications and follow-up.  4 abdominal bruit-schedule ultrasound to exclude aneurysm.  Kirk Ruths, MD

## 2022-05-06 NOTE — Telephone Encounter (Signed)
Pt has asked that an ABX be sent in for Bronchitis as he is still having a cough and realized on his recent CT is was seen he has an infection. Pt states cough is a little worse. No fever at this time.

## 2022-05-07 MED ORDER — LEVOFLOXACIN 500 MG PO TABS: 500.0000 mg | ORAL_TABLET | Freq: Every day | ORAL | 0 refills | Status: AC

## 2022-05-07 NOTE — Telephone Encounter (Signed)
Called pt no answer LMOM w/MD response../lmb 

## 2022-05-07 NOTE — Telephone Encounter (Signed)
Okay.  Will do.  Please follow-up with me in 2 weeks.  Thanks

## 2022-05-20 ENCOUNTER — Ambulatory Visit: Payer: Medicare HMO | Attending: Cardiology | Admitting: Cardiology

## 2022-05-20 ENCOUNTER — Other Ambulatory Visit: Payer: Self-pay | Admitting: *Deleted

## 2022-05-20 ENCOUNTER — Encounter: Payer: Self-pay | Admitting: Cardiology

## 2022-05-20 VITALS — BP 130/70 | HR 68 | Ht 68.0 in | Wt 183.0 lb

## 2022-05-20 DIAGNOSIS — I2510 Atherosclerotic heart disease of native coronary artery without angina pectoris: Secondary | ICD-10-CM

## 2022-05-20 DIAGNOSIS — R0989 Other specified symptoms and signs involving the circulatory and respiratory systems: Secondary | ICD-10-CM | POA: Diagnosis not present

## 2022-05-20 DIAGNOSIS — E7800 Pure hypercholesterolemia, unspecified: Secondary | ICD-10-CM | POA: Diagnosis not present

## 2022-05-20 MED ORDER — ROSUVASTATIN CALCIUM 40 MG PO TABS: 40.0000 mg | ORAL_TABLET | Freq: Every day | ORAL | 3 refills | Status: DC

## 2022-05-20 NOTE — Patient Instructions (Signed)
Medication Instructions:   INCREASE ROSUVASTATIN TO 40 MG ONCE DAILY=8 OF THE 5 MG TABLETS ONCE DAILY  *If you need a refill on your cardiac medications before your next appointment, please call your pharmacy*   Lab Work:  Your physician recommends that you return for lab work in: [place]  If you have labs (blood work) drawn today and your tests are completely normal, you will receive your results only by: [place] (if you have MyChart) OR A paper copy in the mail If you have any lab test that is abnormal or we need to change your treatment, we will call you to review the results.   Testing/Procedures:  Your physician has requested that you have en exercise stress myoview. For further information please visit [email or web address]. Please follow instruction sheet, as given. [place] has requested that you have an abdominal aorta duplex. During this test, an ultrasound is used to evaluate the aorta. Allow 30 minutes for this exam. Do not eat after midnight the day before and avoid carbonated beverages [facility]  Follow-Up: At [facility], you and your health needs are our priority.  As part of our continuing mission to provide you with exceptional heart care, we have created designated Provider Care Teams.  These Care Teams include your primary Cardiologist (physician) and Advanced Practice Providers (APPs -  Physician Assistants and Nurse Practitioners) who all work together to provide you with the care you need, when you need it.  We recommend signing up for the patient portal called "MyChart".  Sign up information is provided on this After Visit Summary.  MyChart is used to connect with patients for Virtual Visits (Telemedicine).  Patients are able to view lab/test results, encounter notes, upcoming appointments, etc.  Non-urgent messages can be sent to your provider as well.   To learn more about what you can do with MyChart,  go to [email or web address].    Your next appointment:   12 month(s)  Provider:   Kirk Ruths MD

## 2022-05-22 ENCOUNTER — Telehealth (HOSPITAL_COMMUNITY): Payer: Self-pay | Admitting: *Deleted

## 2022-05-22 NOTE — Telephone Encounter (Signed)
Patient given detailed instructions per Myocardial Perfusion Study Information Sheet for the test on 05/24/2022 at 10:00. Patient notified to arrive 15 minutes early and that it is imperative to arrive on time for appointment to keep from having the test rescheduled.  If you need to cancel or reschedule your appointment, please call the office within 24 hours of your appointment. . Patient verbalized understanding.Austin Aguilar

## 2022-05-24 ENCOUNTER — Ambulatory Visit (HOSPITAL_COMMUNITY): Payer: Medicare HMO | Attending: Cardiovascular Disease

## 2022-05-24 DIAGNOSIS — I2510 Atherosclerotic heart disease of native coronary artery without angina pectoris: Secondary | ICD-10-CM

## 2022-05-24 LAB — MYOCARDIAL PERFUSION IMAGING
Angina Index: 0
Estimated workload: 10.1
Exercise duration (min): 9 min
Exercise duration (sec): 0 s
LV dias vol: 90 mL (ref 62–150)
LV sys vol: 49 mL
MPHR: 154 {beats}/min
Nuc Stress EF: 45 %
Peak HR: 133 {beats}/min
Percent HR: 86 %
Rest HR: 56 {beats}/min
Rest Nuclear Isotope Dose: 10.1 mCi
SDS: 0
SRS: 0
SSS: 0
Stress Nuclear Isotope Dose: 32.7 mCi
TID: 0.98

## 2022-05-24 MED ORDER — TECHNETIUM TC 99M TETROFOSMIN IV KIT
10.1000 | PACK | Freq: Once | INTRAVENOUS | Status: AC | PRN
  Administered 2022-05-24: 10.1 via INTRAVENOUS

## 2022-05-24 MED ORDER — TECHNETIUM TC 99M TETROFOSMIN IV KIT
32.7000 | PACK | Freq: Once | INTRAVENOUS | Status: AC | PRN
  Administered 2022-05-24: 32.7 via INTRAVENOUS

## 2022-05-27 ENCOUNTER — Encounter: Payer: Self-pay | Admitting: Internal Medicine

## 2022-05-27 ENCOUNTER — Ambulatory Visit: Payer: Medicare HMO | Admitting: Internal Medicine

## 2022-05-27 ENCOUNTER — Telehealth: Payer: Self-pay | Admitting: *Deleted

## 2022-05-27 VITALS — BP 120/80 | HR 67 | Temp 98.7°F | Ht 68.0 in | Wt 183.0 lb

## 2022-05-27 DIAGNOSIS — I2510 Atherosclerotic heart disease of native coronary artery without angina pectoris: Secondary | ICD-10-CM

## 2022-05-27 DIAGNOSIS — R252 Cramp and spasm: Secondary | ICD-10-CM

## 2022-05-27 DIAGNOSIS — E785 Hyperlipidemia, unspecified: Secondary | ICD-10-CM | POA: Diagnosis not present

## 2022-05-27 DIAGNOSIS — R9389 Abnormal findings on diagnostic imaging of other specified body structures: Secondary | ICD-10-CM | POA: Diagnosis not present

## 2022-05-27 DIAGNOSIS — I2583 Coronary atherosclerosis due to lipid rich plaque: Secondary | ICD-10-CM

## 2022-05-27 DIAGNOSIS — I429 Cardiomyopathy, unspecified: Secondary | ICD-10-CM

## 2022-05-27 NOTE — Telephone Encounter (Signed)
Patient is returning call. Requesting return call.  

## 2022-05-27 NOTE — Progress Notes (Signed)
Subjective:  Patient ID: Austin Aguilar, male    DOB: 01/17/1956  Age: 67 y.o. MRN: [id number]  CC: Follow-up (2 WEEK F/U)   HPI Austin Aguilar presents for CAD, RML infiltrate C/o leg cramps  Outpatient Medications Prior to Visit  Medication Sig Dispense Refill   Ascorbic Acid (VITAMIN C) 1000 MG tablet Take 1,000 mg by mouth daily.      aspirin EC 81 MG tablet Take 1 tablet (81 mg total) by mouth daily. 100 tablet 3   Cholecalciferol 1000 units tablet Take 1,000 Units by mouth daily.     Omega-3 Fatty Acids (FISH OIL) 1200 MG CAPS Take 1 capsule by mouth daily.     rosuvastatin (CRESTOR) 40 MG tablet Take 1 tablet (40 mg total) by mouth daily. 90 tablet 3   tamsulosin (FLOMAX) 0.4 MG CAPS capsule Take 1 capsule (0.4 mg total) by mouth daily after supper. 30 capsule 11   No facility-administered medications prior to visit.    ROS: Review of Systems  Constitutional:  Negative for appetite change, fatigue and unexpected weight change.  HENT:  Negative for congestion, nosebleeds, sneezing, sore throat and trouble swallowing.   Eyes:  Negative for itching and visual disturbance.  Respiratory:  Negative for cough.   Cardiovascular:  Negative for chest pain, palpitations and leg swelling.  Gastrointestinal:  Negative for abdominal distention, blood in stool, diarrhea and nausea.  Genitourinary:  Negative for frequency and hematuria.  Musculoskeletal:  Positive for arthralgias and myalgias. Negative for back pain, gait problem, joint swelling and neck pain.  Skin:  Negative for rash.  Neurological:  Negative for dizziness, tremors, speech difficulty and weakness.  Psychiatric/Behavioral:  Negative for agitation, dysphoric mood, sleep disturbance and suicidal ideas. The patient is not nervous/anxious.     Objective:  BP 120/80 (BP Location: Right Arm, Patient Position: Sitting, Cuff Size: Normal)   Pulse 67   Temp 98.7 F (37.1 C) (Oral)   Ht '5\' 8"'$  (1.727 m)   Wt 183 lb (83  kg)   SpO2 98%   BMI 27.83 kg/m   BP Readings from Last 3 Encounters:  05/27/22 120/80  05/20/22 130/70  03/25/22 120/70    Wt Readings from Last 3 Encounters:  05/27/22 183 lb (83 kg)  05/20/22 183 lb (83 kg)  03/25/22 193 lb (87.5 kg)    Physical Exam Constitutional:      General: He is not in acute distress.    Appearance: Normal appearance. He is well-developed.     Comments: NAD  Eyes:     Conjunctiva/sclera: Conjunctivae normal.     Pupils: Pupils are equal, round, and reactive to light.  Neck:     Thyroid: No thyromegaly.     Vascular: No JVD.  Cardiovascular:     Rate and Rhythm: Normal rate and regular rhythm.     Heart sounds: Normal heart sounds. No murmur heard.    No friction rub. No gallop.  Pulmonary:     Effort: Pulmonary effort is normal. No respiratory distress.     Breath sounds: Normal breath sounds. No wheezing or rales.  Chest:     Chest wall: No tenderness.  Abdominal:     General: Bowel sounds are normal. There is no distension.     Palpations: Abdomen is soft. There is no mass.     Tenderness: There is no abdominal tenderness. There is no guarding or rebound.  Musculoskeletal:        General: No tenderness. Normal  range of motion.     Cervical back: Normal range of motion.  Lymphadenopathy:     Cervical: No cervical adenopathy.  Skin:    General: Skin is warm and dry.     Findings: No rash.  Neurological:     Mental Status: He is alert and oriented to person, place, and time.     Cranial Nerves: No cranial nerve deficit.     Motor: No abnormal muscle tone.     Coordination: Coordination normal.     Gait: Gait normal.     Deep Tendon Reflexes: Reflexes are normal and symmetric. Reflexes normal.  Psychiatric:        Behavior: Behavior normal.        Thought Content: Thought content normal.        Judgment: Judgment normal.     Lab Results  Component Value Date   WBC 5.7 03/25/2022   HGB 15.4 03/25/2022   HCT 45.2 03/25/2022    PLT 262.0 03/25/2022   GLUCOSE 79 03/25/2022   CHOL 153 03/25/2022   TRIG 88.0 03/25/2022   HDL 39.40 03/25/2022   LDLDIRECT 114.0 08/06/2016   LDLCALC 96 03/25/2022   ALT 16 03/25/2022   AST 16 03/25/2022   NA 141 03/25/2022   K 4.7 03/25/2022   CL 106 03/25/2022   CREATININE 0.90 03/25/2022   BUN 18 03/25/2022   CO2 28 03/25/2022   TSH 2.64 03/25/2022   PSA 0.71 03/25/2022   HGBA1C 5.1 02/10/2017    CT CARDIAC SCORING ([facility] LOCATIONS ONLY)  Result Date: 04/26/2022 CLINICAL DATA:  67 year old white male.  Preventive care * Tracking Code: [place] * EXAM: CT CARDIAC CORONARY ARTERY CALCIUM SCORE TECHNIQUE: Non-contrast imaging through the heart was performed using prospective ECG gating. Image post processing was performed on an independent workstation, allowing for quantitative analysis of the heart and coronary arteries. Note that this exam targets the heart and the chest was not imaged in its entirety. COMPARISON:  None available. FINDINGS: Technical quality: Good CORONARY CALCIUM SCORES: Left Main: No coronary artery calcification LAD: 331 LCx: 305 RCA: 91 CORONARY CALCIUM Total Agatston Score: 727 MESA database percentile: 86 Ascending aorta normal diameter. EXTRACARDIAC FINDINGS: Limited view of the lung parenchyma demonstrates mild bronchiectasis and peribronchial haziness in the medial lingula. Airways are normal. Limited view of the mediastinum demonstrates no adenopathy. Esophagus normal. Limited view of the upper abdomen is unremarkable. Limited view of the skeleton and chest wall is unremarkable. IMPRESSION: 1. Three-vessel coronary artery calcification. 2. Total Agatston Score: 727 3. MESA age, race  and sex matched database percentile: 86th 4. Small focus aacute or chronic pulmonary infection RIGHT middle lobe. Electronically Signed   By: Suzy Bouchard M.D.   On: 04/26/2022 14:14    Assessment & Plan:   Problem List Items Addressed This Visit       Cardiovascular and  Mediastinum   Coronary atherosclerosis    ECHO - pending        Other   Leg cramp - Primary    Use Magnesium oil spray for muscle cramps CoQ10      Dyslipidemia    On Crestor - cramps Use Magnesium oil spray for muscle cramps CoQ10      Abnormal CT of the chest    Repeat chest CT in 3 mo         No orders of the defined types were placed in this encounter.     Follow-up: Return in about 3 months (around 08/27/2022)  for a follow-up visit.  Walker Kehr, MD

## 2022-05-27 NOTE — Telephone Encounter (Signed)
Patient aware of results and recommendations. Order placed.

## 2022-05-27 NOTE — Telephone Encounter (Signed)
Left message for pt to call.

## 2022-05-27 NOTE — Assessment & Plan Note (Addendum)
Use Magnesium oil spray for muscle cramps CoQ10

## 2022-05-27 NOTE — Patient Instructions (Signed)
Use Magnesium oil spray for muscle cramps 

## 2022-05-27 NOTE — Telephone Encounter (Signed)
-----   Message from Lelon Perla, MD sent at 05/27/2022  7:38 AM EDT ----- Normal perfusion; schedule echo to better assess LV function Kirk Ruths

## 2022-05-27 NOTE — Assessment & Plan Note (Signed)
On Crestor - cramps Use Magnesium oil spray for muscle cramps CoQ10

## 2022-05-27 NOTE — Assessment & Plan Note (Addendum)
ECHO - pending Carotid Doppler offered

## 2022-05-27 NOTE — Assessment & Plan Note (Signed)
Repeat chest CT in 3 mo

## 2022-05-31 ENCOUNTER — Ambulatory Visit (HOSPITAL_COMMUNITY)
Admission: RE | Admit: 2022-05-31 | Discharge: 2022-05-31 | Disposition: A | Discharge status: Home / Self Care | Payer: Medicare HMO | Source: Ambulatory Visit | Attending: Cardiology | Admitting: Cardiology

## 2022-05-31 DIAGNOSIS — R0989 Other specified symptoms and signs involving the circulatory and respiratory systems: Secondary | ICD-10-CM | POA: Insufficient documentation

## 2022-06-24 ENCOUNTER — Ambulatory Visit (HOSPITAL_COMMUNITY): Payer: Medicare HMO | Attending: Cardiology

## 2022-06-24 DIAGNOSIS — I2510 Atherosclerotic heart disease of native coronary artery without angina pectoris: Secondary | ICD-10-CM | POA: Diagnosis not present

## 2022-06-24 DIAGNOSIS — I351 Nonrheumatic aortic (valve) insufficiency: Secondary | ICD-10-CM | POA: Diagnosis not present

## 2022-06-24 DIAGNOSIS — I340 Nonrheumatic mitral (valve) insufficiency: Secondary | ICD-10-CM

## 2022-06-24 DIAGNOSIS — Z87891 Personal history of nicotine dependence: Secondary | ICD-10-CM | POA: Diagnosis not present

## 2022-06-24 DIAGNOSIS — I429 Cardiomyopathy, unspecified: Secondary | ICD-10-CM | POA: Diagnosis not present

## 2022-06-24 DIAGNOSIS — E785 Hyperlipidemia, unspecified: Secondary | ICD-10-CM | POA: Insufficient documentation

## 2022-06-24 LAB — ECHOCARDIOGRAM COMPLETE
Area-P 1/2: 2.51 cm2
S' Lateral: 3.25 cm

## 2022-07-21 ENCOUNTER — Encounter: Payer: Self-pay | Admitting: Internal Medicine

## 2022-10-26 ENCOUNTER — Other Ambulatory Visit: Payer: Self-pay | Admitting: Internal Medicine

## 2022-10-26 DIAGNOSIS — Z0000 Encounter for general adult medical examination without abnormal findings: Secondary | ICD-10-CM

## 2022-12-19 ENCOUNTER — Encounter: Payer: Self-pay | Admitting: Internal Medicine

## 2022-12-19 ENCOUNTER — Other Ambulatory Visit: Payer: Self-pay | Admitting: Internal Medicine

## 2022-12-19 ENCOUNTER — Ambulatory Visit (INDEPENDENT_AMBULATORY_CARE_PROVIDER_SITE_OTHER): Payer: Medicare HMO | Admitting: Internal Medicine

## 2022-12-19 VITALS — BP 118/80 | HR 66 | Temp 98.7°F | Ht 68.0 in | Wt 184.0 lb

## 2022-12-19 DIAGNOSIS — Z0000 Encounter for general adult medical examination without abnormal findings: Secondary | ICD-10-CM

## 2022-12-19 DIAGNOSIS — Z1211 Encounter for screening for malignant neoplasm of colon: Secondary | ICD-10-CM | POA: Diagnosis not present

## 2022-12-19 DIAGNOSIS — I2583 Coronary atherosclerosis due to lipid rich plaque: Secondary | ICD-10-CM

## 2022-12-19 DIAGNOSIS — E785 Hyperlipidemia, unspecified: Secondary | ICD-10-CM

## 2022-12-19 DIAGNOSIS — N401 Enlarged prostate with lower urinary tract symptoms: Secondary | ICD-10-CM

## 2022-12-19 DIAGNOSIS — R350 Frequency of micturition: Secondary | ICD-10-CM

## 2022-12-19 MED ORDER — PITAVASTATIN MAGNESIUM 1 MG PO TABS
1.0000 mg | ORAL_TABLET | Freq: Every day | ORAL | 11 refills | Status: DC
Start: 2022-12-19 — End: 2022-12-21

## 2022-12-19 MED ORDER — FINASTERIDE 5 MG PO TABS
5.0000 mg | ORAL_TABLET | Freq: Every day | ORAL | 3 refills | Status: DC
Start: 2022-12-19 — End: 2023-11-25

## 2022-12-19 NOTE — Progress Notes (Signed)
Subjective:  Patient ID: Austin Aguilar, male    DOB: [birth year]  Age: 67 y.o. MRN: [id number]  CC: Follow-up (6 MN[facility] F/U)   HPI Austin Aguilar presents for a well exam C/o pains w/statin Rx - he stopped   Outpatient Medications Prior to Visit  Medication Sig Dispense Refill   Ascorbic Acid (VITAMIN C) 1000 MG tablet Take 1,000 mg by mouth daily.      aspirin EC 81 MG tablet Take 1 tablet (81 mg total) by mouth daily. 100 tablet 3   Cholecalciferol 1000 units tablet Take 1,000 Units by mouth daily.     Omega-3 Fatty Acids (FISH OIL) 1200 MG CAPS Take 1 capsule by mouth daily.     tamsulosin (FLOMAX) 0.4 MG CAPS capsule TAKE 1 CAPSULE BY MOUTH EVERY DAY AFTER SUPPER 90 capsule 3   rosuvastatin (CRESTOR) 40 MG tablet Take 1 tablet (40 mg total) by mouth daily. 90 tablet 3   No facility-administered medications prior to visit.    ROS: Review of Systems  Constitutional:  Negative for appetite change, fatigue and unexpected weight change.  HENT:  Negative for congestion, nosebleeds, sneezing, sore throat and trouble swallowing.   Eyes:  Negative for itching and visual disturbance.  Respiratory:  Negative for cough.   Cardiovascular:  Negative for chest pain, palpitations and leg swelling.  Gastrointestinal:  Negative for abdominal distention, blood in stool, diarrhea and nausea.  Genitourinary:  Negative for frequency and hematuria.  Musculoskeletal:  Positive for arthralgias. Negative for back pain, gait problem, joint swelling and neck pain.  Skin:  Negative for rash.  Neurological:  Negative for dizziness, tremors, speech difficulty and weakness.  Psychiatric/Behavioral:  Negative for agitation, dysphoric mood and sleep disturbance. The patient is not nervous/anxious.     Objective:  BP 118/80 (BP Location: Right Arm, Patient Position: Sitting, Cuff Size: Normal)   Pulse 66   Temp 98.7 F (37.1 C) (Oral)   Ht 5\' 8"  (1.727 m)   Wt 184 lb (83.5 kg)   SpO2 97%   BMI  27.98 kg/m   BP Readings from Last 3 Encounters:  12/19/22 118/80  05/27/22 120/80  05/20/22 130/70    Wt Readings from Last 3 Encounters:  12/19/22 184 lb (83.5 kg)  05/27/22 183 lb (83 kg)  05/20/22 183 lb (83 kg)    Physical Exam Constitutional:      General: He is not in acute distress.    Appearance: Normal appearance. He is well-developed.     Comments: NAD  Eyes:     Conjunctiva/sclera: Conjunctivae normal.     Pupils: Pupils are equal, round, and reactive to light.  Neck:     Thyroid: No thyromegaly.     Vascular: No JVD.  Cardiovascular:     Rate and Rhythm: Normal rate and regular rhythm.     Heart sounds: Normal heart sounds. No murmur heard.    No friction rub. No gallop.  Pulmonary:     Effort: Pulmonary effort is normal. No respiratory distress.     Breath sounds: Normal breath sounds. No wheezing or rales.  Chest:     Chest wall: No tenderness.  Abdominal:     General: Bowel sounds are normal. There is no distension.     Palpations: Abdomen is soft. There is no mass.     Tenderness: There is no abdominal tenderness. There is no guarding or rebound.  Musculoskeletal:        General: No tenderness. Normal range  of motion.     Cervical back: Normal range of motion.  Lymphadenopathy:     Cervical: No cervical adenopathy.  Skin:    General: Skin is warm and dry.     Findings: No rash.  Neurological:     Mental Status: He is alert and oriented to person, place, and time.     Cranial Nerves: No cranial nerve deficit.     Motor: No abnormal muscle tone.     Coordination: Coordination normal.     Gait: Gait normal.     Deep Tendon Reflexes: Reflexes are normal and symmetric.  Psychiatric:        Behavior: Behavior normal.        Thought Content: Thought content normal.        Judgment: Judgment normal.    Pt declined rectal exam  Lab Results  Component Value Date   WBC 5.7 03/25/2022   HGB 15.4 03/25/2022   HCT 45.2 03/25/2022   PLT 262.0  03/25/2022   GLUCOSE 79 03/25/2022   CHOL 153 03/25/2022   TRIG 88.0 03/25/2022   HDL 39.40 03/25/2022   LDLDIRECT 114.0 08/06/2016   LDLCALC 96 03/25/2022   ALT 16 03/25/2022   AST 16 03/25/2022   NA 141 03/25/2022   K 4.7 03/25/2022   CL 106 03/25/2022   CREATININE 0.90 03/25/2022   BUN 18 03/25/2022   CO2 28 03/25/2022   TSH 2.64 03/25/2022   PSA 0.71 03/25/2022   HGBA1C 5.1 02/10/2017    VAS [place] AAA DUPLEX  Result Date: 05/31/2022 ABDOMINAL AORTA STUDY Patient Name:  Austin Aguilar Polinsky  Date of Exam:   05/31/2022 Medical Rec #: [id number]         Accession #:    [id number] Date of Birth: Aug 02, 1955         Patient Gender: M Patient Age:   71 years Exam Location:  [facility] Procedure:      VAS [place] AAA DUPLEX Referring Phys: BRIAN CRENSHAW --------------------------------------------------------------------------------  Indications: Abdominal Bruit. Patient denies any family history of aneurysm or              any abdominal pain. Risk Factors: Hypertension, hyperlipidemia, past history of smoking, coronary               artery disease.  Comparison Study: NA Performing Technologist: Jeryl Columbia RDCS  Examination Guidelines: A complete evaluation includes B-mode imaging, spectral Doppler, color Doppler, and power Doppler as needed of all accessible portions of each vessel. Bilateral testing is considered an integral part of a complete examination. Limited examinations for reoccurring indications may be performed as noted.  Abdominal Aorta Findings: +-------------+-------+----------+----------+---------+--------+--------+ Location     AP (cm)Trans (cm)PSV (cm/s)Waveform ThrombusComments +-------------+-------+----------+----------+---------+--------+--------+ Proximal     2.36   2.36      98        biphasic                  +-------------+-------+----------+----------+---------+--------+--------+ Mid          2.13   2.09      77        triphasic                  +-------------+-------+----------+----------+---------+--------+--------+ Distal       1.81   1.89      67        triphasic                 +-------------+-------+----------+----------+---------+--------+--------+ RT CIA Prox  1.2  1.2       58        triphasic                 +-------------+-------+----------+----------+---------+--------+--------+ RT CIA Mid                    103       triphasic                 +-------------+-------+----------+----------+---------+--------+--------+ RT CIA Distal                 83        triphasic                 +-------------+-------+----------+----------+---------+--------+--------+ RT EIA Prox  1.1    1.1       97        triphasic                 +-------------+-------+----------+----------+---------+--------+--------+ LT CIA Prox  1.3    1.3       80        triphasic                 +-------------+-------+----------+----------+---------+--------+--------+ LT CIA Mid                    93        triphasic                 +-------------+-------+----------+----------+---------+--------+--------+ LT CIA Distal                 94        triphasic                 +-------------+-------+----------+----------+---------+--------+--------+ LT EIA Prox  1.2    1.2       100       triphasic                 +-------------+-------+----------+----------+---------+--------+--------+  Summary: Abdominal Aorta: No evidence of an abdominal aortic aneurysm was visualized. No previous exam available for comparison.  *See table(s) above for measurements and observations.  Electronically signed by Nanetta Batty MD on 05/31/2022 at 1:28:11 PM.    Final     Assessment & Plan:   Problem List Items Addressed This Visit     Well adult exam - Primary    We discussed age appropriate health related issues, including available/recomended screening tests and vaccinations - declined. Labs were ordered to be later reviewed .  All questions were answered. We discussed one or more of the following - seat belt use, use of sunscreen/sun exposure exercise, safe sex, fall risk reduction, second hand smoke exposure, firearm use and storage, seat belt use, a need for adhering to healthy diet and exercise. Labs were ordered.  All questions were answered. cardiac CT scan for calcium scoring offered 2020. Total [place] calcium CT Score: 727 - 2024 cologuard due 2021, ordered      Relevant Orders   TSH   Urinalysis   CBC with Differential/Platelet   Lipid panel   PSA   Comprehensive metabolic panel   BPH (benign prostatic hyperplasia)    On Flomax, added Finasteride      Relevant Medications   finasteride (PROSCAR) 5 MG tablet   Dyslipidemia     Total [place] calcium CT Score: 727 - 2024 D/c Crestor - cramps. Try Zypatamag      Relevant Medications   Pitavastatin Magnesium 1 MG TABS  Other Relevant Orders   TSH   Lipid panel   Coronary atherosclerosis    coronary calcium CT score is 727. On baby aspirin and try Zypatamag Crestor intolerant Cardiology - Dr Jens Som      Relevant Medications   Pitavastatin Magnesium 1 MG TABS   Other Relevant Orders   CK   Other Visit Diagnoses     Screening for colon cancer       Relevant Orders   Cologuard         Meds ordered this encounter  Medications   Pitavastatin Magnesium 1 MG TABS    Sig: Take 1 mg by mouth daily.    Dispense:  30 tablet    Refill:  11    Crestor intolerant   finasteride (PROSCAR) 5 MG tablet    Sig: Take 1 tablet (5 mg total) by mouth daily.    Dispense:  90 tablet    Refill:  3      Follow-up: Return in about 6 months (around 06/19/2023) for Wellness Exam.  Sonda Primes, MD

## 2022-12-19 NOTE — Assessment & Plan Note (Addendum)
We discussed age appropriate health related issues, including available/recomended screening tests and vaccinations - declined. Labs were ordered to be later reviewed . All questions were answered. We discussed one or more of the following - seat belt use, use of sunscreen/sun exposure exercise, safe sex, fall risk reduction, second hand smoke exposure, firearm use and storage, seat belt use, a need for adhering to healthy diet and exercise. Labs were ordered.  All questions were answered. cardiac CT scan for calcium scoring offered 2020. Total Agatston calcium CT Score: 727 - 2024 cologuard due 2021, ordered

## 2022-12-19 NOTE — Assessment & Plan Note (Signed)
coronary calcium CT score is 727. On baby aspirin and try Zypatamag Crestor intolerant Cardiology - Dr Jens Som

## 2022-12-19 NOTE — Assessment & Plan Note (Signed)
On Flomax, added Finasteride

## 2022-12-19 NOTE — Assessment & Plan Note (Signed)
Total Agatston calcium CT Score: 727 - 2024 D/c Crestor - cramps. Try Zypatamag

## 2023-01-03 LAB — COLOGUARD: COLOGUARD: NEGATIVE

## 2023-05-02 ENCOUNTER — Ambulatory Visit: Payer: Medicare HMO

## 2023-05-02 VITALS — Ht 68.0 in | Wt 184.0 lb

## 2023-05-02 DIAGNOSIS — Z0000 Encounter for general adult medical examination without abnormal findings: Secondary | ICD-10-CM

## 2023-05-02 NOTE — Progress Notes (Addendum)
Subjective:   Austin Aguilar is a 68 y.o. male who presents for an Initial Medicare Annual Wellness Visit. (Pt of Dr Posey Rea)  Visit Complete: Virtual I connected with  Austin Aguilar on 05/02/23 by a audio enabled telemedicine application and verified that I am speaking with the correct person using two identifiers.  Patient Location: Home  Provider Location: Office/Clinic  I discussed the limitations of evaluation and management by telemedicine. The patient expressed understanding and agreed to proceed.  Vital Signs: Because this visit was a virtual/telehealth visit, some criteria may be missing or patient reported. Any vitals not documented were not able to be obtained and vitals that have been documented are patient reported.  Patient Medicare AWV questionnaire was completed by the patient on 05/01/2023; I have confirmed that all information answered by patient is correct and no changes since this date.  Cardiac Risk Factors include: advanced age (>79men, >51 women);dyslipidemia;male gender     Objective:    Today's Vitals   05/02/23 1047  Weight: 184 lb (83.5 kg)  Height: 5\' 8"  (1.727 m)   Body mass index is 27.98 kg/m.     05/02/2023   10:46 AM 03/31/2012    1:17 PM 11/21/2011    9:43 AM 11/19/2011    8:55 AM  Advanced Directives  Does Patient Have a Medical Advance Directive? Yes Patient does not have advance directive;Patient would not like information  Patient does not have advance directive  Type of Advance Directive Healthcare Power of [place];Living will     Copy of Healthcare Power of Attorney in Chart? No - copy requested     Pre-existing out of facility DNR order (yellow form or pink MOST form)   No     Current Medications (verified) Outpatient Encounter Medications as of 05/02/2023  Medication Sig   Ascorbic Acid (VITAMIN C) 1000 MG tablet Take 1,000 mg by mouth daily.    Cholecalciferol 1000 units tablet Take 1,000 Units by mouth daily.   finasteride  (PROSCAR) 5 MG tablet Take 1 tablet (5 mg total) by mouth daily.   Omega-3 Fatty Acids (FISH OIL) 1200 MG CAPS Take 1 capsule by mouth daily.   pravastatin (PRAVACHOL) 20 MG tablet Take 1 tablet (20 mg total) by mouth daily.   [DISCONTINUED] tamsulosin (FLOMAX) 0.4 MG CAPS capsule TAKE 1 CAPSULE BY MOUTH EVERY DAY AFTER SUPPER   No facility-administered encounter medications on file as of 05/02/2023.    Allergies (verified) Crestor [rosuvastatin]   History: Past Medical History:  Diagnosis Date   Anxiety    Arthritis    hands   BPH (benign prostatic hyperplasia)    Depression    denies at present- states resolved- changed jobs   Diverticulosis of colon    Grief 2015-09-23   wife passed away   HTN (hypertension) 03/18/2009   12/10  stress  test Dr Eden Emms EPIC   Hydrocele    Past Surgical History:  Procedure Laterality Date   HERNIA REPAIR  1978   [facility]   HYDROCELE EXCISION  04/02/2012   Procedure: HYDROCELECTOMY ADULT;  Surgeon: Anner Crete, MD;  Location: [facility];  Service: Urology;  Laterality: Right;   INGUINAL HERNIA REPAIR  11/21/2011   Procedure: LAPAROSCOPIC INGUINAL HERNIA;  Surgeon: Ardeth Sportsman, MD;  Location: [facility];  Service: General;  Laterality: Bilateral;  BILATERAL INGUINAL HERNIA REPAIRS LAPARASCOPIC   SHOULDER ARTHROSCOPY  01/01/11   VENTRAL HERNIA REPAIR  11/21/2011   Procedure: LAPAROSCOPIC VENTRAL HERNIA;  Surgeon: Ardeth Sportsman, MD;  Location: [facility];  Service: General;  Laterality: N/A;  laparoscopic ventral wall hernia repair   Family History  Problem Relation Age of Onset   Hypertension Mother    Hyperlipidemia Mother    Stroke Mother    Coronary artery disease Father        ?CABG    Alcohol abuse Sister    Drug abuse Sister    Coronary artery disease Brother    Alcohol abuse Brother    Drug abuse Brother    COPD Brother    Hypertension Brother    Heart disease Brother    Social History   Socioeconomic History    Marital status: Significant Other    Spouse name: Not on file   Number of children: 3   Years of education: Not on file   Highest education level: 12th grade  Occupational History   Occupation: Scientific laboratory technician: [employer]    Comment: [employer]/50-60 hr wk week  Tobacco Use   Smoking status: Former    Current packs/day: 0.00    Types: Cigarettes, Pipe, Cigars    Quit date: 11/18/1988    Years since quitting: 34.4   Smokeless tobacco: Never   Tobacco comments:    quit 1990's/2nd hand exposure from wife (min)  Substance and Sexual Activity   Alcohol use: Never    Alcohol/week: 3.0 standard drinks of alcohol    Types: 3 Cans of beer per week   Drug use: No   Sexual activity: Yes  Other Topics Concern   Not on file  Social History Narrative   Regular exercise - YES, running            Social Drivers of Health   Financial Resource Strain: Low Risk  (05/02/2023)   Overall Financial Resource Strain (CARDIA)    Difficulty of Paying Living Expenses: Not hard at all  Food Insecurity: No Food Insecurity (05/02/2023)   Hunger Vital Sign    Worried About Running Out of Food in the Last Year: Never true    Ran Out of Food in the Last Year: Never true  Transportation Needs: No Transportation Needs (05/02/2023)   PRAPARE - Administrator, Civil Service (Medical): No    Lack of Transportation (Non-Medical): No  Physical Activity: Sufficiently Active (05/02/2023)   Exercise Vital Sign    Days of Exercise per Week: 7 days    Minutes of Exercise per Session: 60 min  Stress: No Stress Concern Present (05/02/2023)   [employer] of Occupational Health - Occupational Stress Questionnaire    Feeling of Stress : Not at all  Social Connections: Moderately Isolated (05/02/2023)   Social Connection and Isolation Panel [NHANES]    Frequency of Communication with Friends and Family: More than three times a week    Frequency of Social Gatherings with Friends and Family: Once a  week    Attends Religious Services: Never    Database administrator or Organizations: No    Attends Engineer, structural: Never    Marital Status: Living with partner    Tobacco Counseling Counseling given: Not Answered Tobacco comments: quit 1990's/2nd hand exposure from wife (min)   Clinical Intake:  Pre-visit preparation completed: Yes  Pain : No/denies pain     BMI - recorded: 27.98 Nutritional Status: BMI 25 -29 Overweight Nutritional Risks: None Diabetes: No  How often do you need to have someone help you when you read instructions,  pamphlets, or other written materials from your doctor or pharmacy?: 1 - Never  Interpreter Needed?: No  Information entered by :: Hassell Halim, CMA   Activities of Daily Living    05/02/2023   10:49 AM 05/01/2023    8:32 PM  In your present state of health, do you have any difficulty performing the following activities:  Hearing? 0 0  Vision? 0 0  Difficulty concentrating or making decisions? 0 0  Walking or climbing stairs? 0 0  Dressing or bathing? 0 0  Doing errands, shopping? 0 0  Preparing Food and eating ? N N  Using the Toilet? N N  In the past six months, have you accidently leaked urine? N N  Do you have problems with loss of bowel control? N N  Managing your Medications? N N  Managing your Finances? N N  Housekeeping or managing your Housekeeping? N N    Patient Care Team: Plotnikov, Georgina Quint, MD as PCP - General (Internal Medicine) Karie Soda, MD as Consulting Physician (General Surgery) Bjorn Pippin, MD as Consulting Physician (Urology) Jens Som Madolyn Frieze, MD as Consulting Physician (Cardiology)  Indicate any recent Medical Services you may have received from other than [facility] providers in the past year (date may be approximate).     Assessment:   This is a routine wellness examination for Austin Aguilar.  Hearing/Vision screen No results found.   Goals Addressed               This Visit's  Progress     Patient Stated (pt-stated)        Patient stated that he plans to "stay healthy."       Depression Screen    05/02/2023   10:53 AM 12/19/2022    7:59 AM 05/27/2022    3:51 PM 03/25/2022    8:53 AM 09/08/2018    8:26 AM 08/06/2016   10:37 AM  PHQ 2/9 Scores  PHQ - 2 Score 0 0 0 0 0 0  PHQ- 9 Score    1 0     Fall Risk    05/01/2023    8:32 PM 12/19/2022    7:59 AM 05/27/2022    3:51 PM 03/25/2022    8:53 AM 08/06/2016   10:37 AM  Fall Risk   Falls in the past year? 0 0 0 0 No  Number falls in past yr: 0 0 0 0   Injury with Fall? 0 0 0 0   Risk for fall due to :  No Fall Risks No Fall Risks No Fall Risks   Follow up  Falls evaluation completed Falls evaluation completed Falls evaluation completed     MEDICARE RISK AT HOME: Medicare Risk at Home Any stairs in or around the home?: (Patient-Rptd) Yes If so, are there any without handrails?: (Patient-Rptd) No Home free of loose throw rugs in walkways, pet beds, electrical cords, etc?: (Patient-Rptd) Yes Adequate lighting in your home to reduce risk of falls?: (Patient-Rptd) Yes Life alert?: (Patient-Rptd) No Use of a cane, walker or w/c?: (Patient-Rptd) No Grab bars in the bathroom?: (Patient-Rptd) Yes Shower chair or bench in shower?: (Patient-Rptd) No Elevated toilet seat or a handicapped toilet?: (Patient-Rptd) No  TIMED UP AND GO:  Was the test performed? No    Cognitive Function:        Immunizations Immunization History  Administered Date(s) Administered   Influenza Split 01/10/2011   Influenza Whole 11/30/2009   Influenza, Seasonal, Injecte, Preservative Fre 03/19/2012   Influenza-Unspecified  01/16/2013, 12/16/2013, 12/16/2014, 12/09/2016   Pneumococcal Conjugate-13 02/10/2017   Pneumococcal Polysaccharide-23 01/10/2011   Tdap 03/22/2013    TDAP status: Due, Education has been provided regarding the importance of this vaccine. Advised may receive this vaccine at local pharmacy or Health Dept.  Aware to provide a copy of the vaccination record if obtained from local pharmacy or Health Dept. Verbalized acceptance and understanding.  Flu Vaccine status: Due, Education has been provided regarding the importance of this vaccine. Advised may receive this vaccine at local pharmacy or Health Dept. Aware to provide a copy of the vaccination record if obtained from local pharmacy or Health Dept. Verbalized acceptance and understanding.  Pneumococcal vaccine status: Due, Education has been provided regarding the importance of this vaccine. Advised may receive this vaccine at local pharmacy or Health Dept. Aware to provide a copy of the vaccination record if obtained from local pharmacy or Health Dept. Verbalized acceptance and understanding.  Covid-19 vaccine status: Declined, Education has been provided regarding the importance of this vaccine but patient still declined. Advised may receive this vaccine at local pharmacy or Health Dept.or vaccine clinic. Aware to provide a copy of the vaccination record if obtained from local pharmacy or Health Dept. Verbalized acceptance and understanding.  Qualifies for Shingles Vaccine? Yes   Zostavax completed No   Shingrix Completed?: No.    Education has been provided regarding the importance of this vaccine. Patient has been advised to call insurance company to determine out of pocket expense if they have not yet received this vaccine. Advised may also receive vaccine at local pharmacy or Health Dept. Verbalized acceptance and understanding.  Screening Tests Health Maintenance  Topic Date Due   Pneumonia Vaccine 5+ Years old (3 of 3 - PPSV23 or PCV20) 04/10/2021   DTaP/Tdap/Td (2 - Td or Tdap) 03/23/2023   INFLUENZA VACCINE  06/16/2023 (Originally 10/17/2022)   Medicare Annual Wellness (AWV)  05/01/2024   Fecal DNA (Cologuard)  12/28/2025   Hepatitis C Screening  Completed   HPV VACCINES  Aged Out   COVID-19 Vaccine  Discontinued   Zoster Vaccines-  Shingrix  Discontinued    Health Maintenance  Health Maintenance Due  Topic Date Due   Pneumonia Vaccine 105+ Years old (3 of 3 - PPSV23 or PCV20) 04/10/2021   DTaP/Tdap/Td (2 - Td or Tdap) 03/23/2023    Colorectal cancer screening: Type of screening: Cologuard. Completed 12/29/2022. Repeat every 3 years   Additional Screening:  Hepatitis C Screening: does qualify; Completed 04/05/2015  Vision Screening: Recommended annual ophthalmology exams for early detection of glaucoma and other disorders of the eye. Is the patient up to date with their annual eye exam?  Yes  Who is the provider or what is the name of the office in which the patient attends annual eye exams? [facility] If pt is not established with a provider, would they like to be referred to a provider to establish care? No .   Dental Screening: Recommended annual dental exams for proper oral hygiene   Community Resource Referral / Chronic Care Management: CRR required this visit?  No   CCM required this visit?  No    Plan:     I have personally reviewed and noted the following in the patient's chart:   Medical and social history Use of alcohol, tobacco or illicit drugs  Current medications and supplements including opioid prescriptions. Patient is not currently taking opioid prescriptions. Functional ability and status Nutritional status Physical activity Advanced directives List of other  physicians Hospitalizations, surgeries, and ER visits in previous 12 months Vitals Screenings to include cognitive, depression, and falls Referrals and appointments  In addition, I have reviewed and discussed with patient certain preventive protocols, quality metrics, and best practice recommendations. A written personalized care plan for preventive services as well as general preventive health recommendations were provided to patient.     Darreld Mclean, CMA   05/02/2023   After Visit Summary: (MyChart) Due to this  being a telephonic visit, the after visit summary with patients personalized plan was offered to patient via MyChart   Nurse Notes: Advised of due vaccines (Flu, Pneumonia, and Tdap).  Patient will upload a copy of his Advance Directives to MyChart.

## 2023-05-02 NOTE — Patient Instructions (Addendum)
Mr. Scicchitano , Thank you for taking time to come for your Medicare Wellness Visit. I appreciate your ongoing commitment to your health goals. Please review the following plan we discussed and let me know if I can assist you in the future.   Referrals/Orders/Follow-Ups/Clinician Recommendations: Aim for 30 minutes of exercise or brisk walking, 6-8 glasses of water, and 5 servings of fruits and vegetables each day. Recommended InFluenza, Tdap and Pneumonia vaccines at local pharmacy.    This is a list of the screening recommended for you and due dates:  Health Maintenance  Topic Date Due   Pneumonia Vaccine (3 of 3 - PPSV23 or PCV20) 04/10/2021   DTaP/Tdap/Td vaccine (2 - Td or Tdap) 03/23/2023   Flu Shot  06/16/2023*   Medicare Annual Wellness Visit  05/01/2024   Cologuard (Stool DNA test)  12/28/2025   Hepatitis C Screening  Completed   HPV Vaccine  Aged Out   COVID-19 Vaccine  Discontinued   Zoster (Shingles) Vaccine  Discontinued  *Topic was postponed. The date shown is not the original due date.    Advanced directives: (Copy Requested) Please bring a copy of your health care power of attorney and living will to the office to be added to your chart at your convenience.  Next Medicare Annual Wellness Visit scheduled for next year: Yes - 04/2024

## 2023-06-29 ENCOUNTER — Other Ambulatory Visit: Payer: Self-pay | Admitting: Internal Medicine

## 2023-11-25 ENCOUNTER — Other Ambulatory Visit: Payer: Self-pay | Admitting: Internal Medicine

## 2023-12-23 ENCOUNTER — Encounter: Payer: Self-pay | Admitting: Internal Medicine

## 2023-12-23 ENCOUNTER — Ambulatory Visit: Payer: Medicare HMO | Admitting: Internal Medicine

## 2023-12-23 VITALS — BP 116/72 | HR 60 | Temp 97.9°F | Ht 68.0 in | Wt 190.0 lb

## 2023-12-23 DIAGNOSIS — R350 Frequency of micturition: Secondary | ICD-10-CM | POA: Diagnosis not present

## 2023-12-23 DIAGNOSIS — N401 Enlarged prostate with lower urinary tract symptoms: Secondary | ICD-10-CM | POA: Diagnosis not present

## 2023-12-23 DIAGNOSIS — I2583 Coronary atherosclerosis due to lipid rich plaque: Secondary | ICD-10-CM | POA: Diagnosis not present

## 2023-12-23 DIAGNOSIS — E785 Hyperlipidemia, unspecified: Secondary | ICD-10-CM

## 2023-12-23 DIAGNOSIS — Z0000 Encounter for general adult medical examination without abnormal findings: Secondary | ICD-10-CM | POA: Diagnosis not present

## 2023-12-23 DIAGNOSIS — R9389 Abnormal findings on diagnostic imaging of other specified body structures: Secondary | ICD-10-CM

## 2023-12-23 LAB — COMPREHENSIVE METABOLIC PANEL WITH GFR
ALT: 17 U/L (ref 0–53)
AST: 20 U/L (ref 0–37)
Albumin: 5 g/dL (ref 3.5–5.2)
Alkaline Phosphatase: 76 U/L (ref 39–117)
BUN: 11 mg/dL (ref 6–23)
CO2: 28 meq/L (ref 19–32)
Calcium: 9.8 mg/dL (ref 8.4–10.5)
Chloride: 103 meq/L (ref 96–112)
Creatinine, Ser: 0.68 mg/dL (ref 0.40–1.50)
GFR: 96.05 mL/min (ref >60.00)
Glucose, Bld: 104 mg/dL — ABNORMAL HIGH (ref 70–99)
Potassium: 4.2 meq/L (ref 3.5–5.1)
Sodium: 140 meq/L (ref 135–145)
Total Bilirubin: 0.8 mg/dL (ref 0.2–1.2)
Total Protein: 7 g/dL (ref 6.0–8.3)

## 2023-12-23 LAB — CBC WITH DIFFERENTIAL/PLATELET
Basophils Absolute: 0.1 K/uL (ref 0.0–0.1)
Basophils Relative: 0.9 % (ref 0.0–3.0)
Eosinophils Absolute: 0.1 K/uL (ref 0.0–0.7)
Eosinophils Relative: 1.4 % (ref 0.0–5.0)
HCT: 43 % (ref 39.0–52.0)
Hemoglobin: 14.5 g/dL (ref 13.0–17.0)
Lymphocytes Relative: 25.6 % (ref 12.0–46.0)
Lymphs Abs: 1.4 K/uL (ref 0.7–4.0)
MCHC: 33.7 g/dL (ref 30.0–36.0)
MCV: 91.6 fl (ref 78.0–100.0)
Monocytes Absolute: 0.5 K/uL (ref 0.1–1.0)
Monocytes Relative: 8.5 % (ref 3.0–12.0)
Neutro Abs: 3.6 K/uL (ref 1.4–7.7)
Neutrophils Relative %: 63.6 % (ref 43.0–77.0)
Platelets: 235 K/uL (ref 150.0–400.0)
RBC: 4.7 Mil/uL (ref 4.22–5.81)
RDW: 13.1 % (ref 11.5–15.5)
WBC: 5.6 K/uL (ref 4.0–10.5)

## 2023-12-23 LAB — TSH: TSH: 2.38 u[IU]/mL (ref 0.35–5.50)

## 2023-12-23 LAB — PSA: PSA: 0.37 ng/mL (ref 0.10–4.00)

## 2023-12-23 LAB — URINALYSIS
Bilirubin Urine: NEGATIVE
Hgb urine dipstick: NEGATIVE
Ketones, ur: NEGATIVE
Leukocytes,Ua: NEGATIVE
Nitrite: NEGATIVE
Specific Gravity, Urine: 1.015 (ref 1.000–1.030)
Total Protein, Urine: NEGATIVE
Urine Glucose: NEGATIVE
Urobilinogen, UA: 0.2 (ref 0.0–1.0)
pH: 7 (ref 5.0–8.0)

## 2023-12-23 LAB — LIPID PANEL
Cholesterol: 172 mg/dL (ref 0–200)
HDL: 50.5 mg/dL (ref >39.00)
LDL Cholesterol: 103 mg/dL — ABNORMAL HIGH (ref 0–99)
NonHDL: 121.37
Total CHOL/HDL Ratio: 3
Triglycerides: 94 mg/dL (ref 0.0–149.0)
VLDL: 18.8 mg/dL (ref 0.0–40.0)

## 2023-12-23 MED ORDER — TADALAFIL 5 MG PO TABS
5.0000 mg | ORAL_TABLET | Freq: Every day | ORAL | 11 refills | Status: AC
Start: 2023-12-23 — End: ?

## 2023-12-23 NOTE — Assessment & Plan Note (Signed)
On Pravastatin, ASA 

## 2023-12-23 NOTE — Assessment & Plan Note (Signed)
 We discussed age appropriate health related issues, including available/recomended screening tests and vaccinations - declined. Labs were ordered to be later reviewed . All questions were answered. We discussed one or more of the following - seat belt use, use of sunscreen/sun exposure exercise, safe sex, fall risk reduction, second hand smoke exposure, firearm use and storage, seat belt use, a need for adhering to healthy diet and exercise. Labs were ordered.  All questions were answered. cardiac CT scan for calcium  scoring offered 2020. Total Agatston calcium  CT Score: 727 - 2024 cologuard (-) 2024

## 2023-12-23 NOTE — Assessment & Plan Note (Signed)
 Worse On Finasteride . Added Cialis  5 mg/d

## 2023-12-23 NOTE — Assessment & Plan Note (Signed)
 Total Agatston calcium  CT Score: 727 - 2024 D/c Crestor  - cramps. Try Zypatamag d/c On Pravastatin

## 2023-12-23 NOTE — Progress Notes (Signed)
 Subjective:  Patient ID: Austin Aguilar, male    DOB: [birth year]  Age: 68 y.o. MRN: [id number]  CC: Annual Exam   HPI Austin Aguilar presents for a well exam C/o BPH sx's  Outpatient Medications Prior to Visit  Medication Sig Dispense Refill   Ascorbic Acid (VITAMIN C) 1000 MG tablet Take 1,000 mg by mouth daily.      Cholecalciferol 1000 units tablet Take 1,000 Units by mouth daily.     finasteride  (PROSCAR ) 5 MG tablet Take 1 tablet (5 mg total) by mouth daily. 90 tablet 3   Omega-3 Fatty Acids (FISH OIL) 1200 MG CAPS Take 1 capsule by mouth daily.     pravastatin (PRAVACHOL) 20 MG tablet TAKE 1 TABLET BY MOUTH EVERY DAY 30 tablet 5   No facility-administered medications prior to visit.    ROS: Review of Systems  Constitutional:  Negative for appetite change, fatigue and unexpected weight change.  HENT:  Negative for congestion, nosebleeds, sneezing, sore throat and trouble swallowing.   Eyes:  Negative for itching and visual disturbance.  Respiratory:  Negative for cough.   Cardiovascular:  Negative for chest pain, palpitations and leg swelling.  Gastrointestinal:  Negative for abdominal distention, blood in stool, diarrhea and nausea.  Genitourinary:  Negative for frequency and hematuria.  Musculoskeletal:  Negative for back pain, gait problem, joint swelling and neck pain.  Skin:  Negative for rash.  Neurological:  Positive for light-headedness. Negative for dizziness, tremors, speech difficulty and weakness.  Psychiatric/Behavioral:  Negative for agitation, dysphoric mood and sleep disturbance. The patient is not nervous/anxious.     Objective:  BP 116/72   Pulse 60   Temp 97.9 F (36.6 C)   Ht 5' 8 (1.727 m)   Wt 190 lb (86.2 kg)   SpO2 97%   BMI 28.89 kg/m   BP Readings from Last 3 Encounters:  12/23/23 116/72  12/19/22 118/80  05/27/22 120/80    Wt Readings from Last 3 Encounters:  12/23/23 190 lb (86.2 kg)  05/02/23 184 lb (83.5 kg)  12/19/22  184 lb (83.5 kg)    Physical Exam Constitutional:      General: He is not in acute distress.    Appearance: He is well-developed.     Comments: NAD  Eyes:     Conjunctiva/sclera: Conjunctivae normal.     Pupils: Pupils are equal, round, and reactive to light.  Neck:     Thyroid : No thyromegaly.     Vascular: No JVD.  Cardiovascular:     Rate and Rhythm: Normal rate and regular rhythm.     Heart sounds: Normal heart sounds. No murmur heard.    No friction rub. No gallop.  Pulmonary:     Effort: Pulmonary effort is normal. No respiratory distress.     Breath sounds: Normal breath sounds. No wheezing or rales.  Chest:     Chest wall: No tenderness.  Abdominal:     General: Bowel sounds are normal. There is no distension.     Palpations: Abdomen is soft. There is no mass.     Tenderness: There is no abdominal tenderness. There is no guarding or rebound.  Genitourinary:    Prostate: Normal.     Rectum: Normal. Guaiac result negative.  Musculoskeletal:        General: No tenderness. Normal range of motion.     Cervical back: Normal range of motion.  Lymphadenopathy:     Cervical: No cervical adenopathy.  Skin:  General: Skin is warm and dry.     Findings: No rash.  Neurological:     Mental Status: He is alert and oriented to person, place, and time.     Cranial Nerves: No cranial nerve deficit.     Motor: No abnormal muscle tone.     Coordination: Coordination normal.     Gait: Gait normal.     Deep Tendon Reflexes: Reflexes are normal and symmetric.  Psychiatric:        Behavior: Behavior normal.        Thought Content: Thought content normal.        Judgment: Judgment normal.    Prostate <1+ Lab Results  Component Value Date   WBC 5.7 03/25/2022   HGB 15.4 03/25/2022   HCT 45.2 03/25/2022   PLT 262.0 03/25/2022   GLUCOSE 79 03/25/2022   CHOL 153 03/25/2022   TRIG 88.0 03/25/2022   HDL 39.40 03/25/2022   LDLDIRECT 114.0 08/06/2016   LDLCALC 96 03/25/2022    ALT 16 03/25/2022   AST 16 03/25/2022   NA 141 03/25/2022   K 4.7 03/25/2022   CL 106 03/25/2022   CREATININE 0.90 03/25/2022   BUN 18 03/25/2022   CO2 28 03/25/2022   TSH 2.64 03/25/2022   PSA 0.71 03/25/2022   HGBA1C 5.1 02/10/2017    VAS US  AAA DUPLEX Result Date: 05/31/2022 ABDOMINAL AORTA STUDY Patient Name:  Austin Aguilar  Date of Exam:   05/31/2022 Medical Rec #: [id number]         Accession #:    [id number] Date of Birth: 1955-12-30         Patient Gender: M Patient Age:   46 years Exam Location:  [facility] Procedure:      VAS US  AAA DUPLEX Referring Phys: BRIAN CRENSHAW --------------------------------------------------------------------------------  Indications: Abdominal Bruit. Patient denies any family history of aneurysm or              any abdominal pain. Risk Factors: Hypertension, hyperlipidemia, past history of smoking, coronary               artery disease.  Comparison Study: NA Performing Technologist: Orvil Holmes RDCS  Examination Guidelines: A complete evaluation includes B-mode imaging, spectral Doppler, color Doppler, and power Doppler as needed of all accessible portions of each vessel. Bilateral testing is considered an integral part of a complete examination. Limited examinations for reoccurring indications may be performed as noted.  Abdominal Aorta Findings: +-------------+-------+----------+----------+---------+--------+--------+ Location     AP (cm)Trans (cm)PSV (cm/s)Waveform ThrombusComments +-------------+-------+----------+----------+---------+--------+--------+ Proximal     2.36   2.36      98        biphasic                  +-------------+-------+----------+----------+---------+--------+--------+ Mid          2.13   2.09      77        triphasic                 +-------------+-------+----------+----------+---------+--------+--------+ Distal       1.81   1.89      67        triphasic                  +-------------+-------+----------+----------+---------+--------+--------+ RT CIA Prox  1.2    1.2       58        triphasic                 +-------------+-------+----------+----------+---------+--------+--------+  RT CIA Mid                    103       triphasic                 +-------------+-------+----------+----------+---------+--------+--------+ RT CIA Distal                 83        triphasic                 +-------------+-------+----------+----------+---------+--------+--------+ RT EIA Prox  1.1    1.1       97        triphasic                 +-------------+-------+----------+----------+---------+--------+--------+ LT CIA Prox  1.3    1.3       80        triphasic                 +-------------+-------+----------+----------+---------+--------+--------+ LT CIA Mid                    93        triphasic                 +-------------+-------+----------+----------+---------+--------+--------+ LT CIA Distal                 94        triphasic                 +-------------+-------+----------+----------+---------+--------+--------+ LT EIA Prox  1.2    1.2       100       triphasic                 +-------------+-------+----------+----------+---------+--------+--------+  Summary: Abdominal Aorta: No evidence of an abdominal aortic aneurysm was visualized. No previous exam available for comparison.  *See table(s) above for measurements and observations.  Electronically signed by Dorn Lesches MD on 05/31/2022 at 1:28:11 PM.    Final     Assessment & Plan:   Problem List Items Addressed This Visit     Abnormal CT of the chest   04/2022 right middle lobe haziness on CT.  Will get a follow-up CT      Relevant Orders   CT Chest Wo Contrast   BPH (benign prostatic hyperplasia)   Worse On Finasteride . Added Cialis  5 mg/d      Relevant Orders   PSA   Coronary atherosclerosis   On Pravastatin, ASA      Relevant Medications    tadalafil  (CIALIS ) 5 MG tablet   Dyslipidemia    Total Agatston calcium  CT Score: 727 - 2024 D/c Crestor  - cramps. Try Zypatamag d/c On Pravastatin      Relevant Orders   TSH   Lipid panel   Well adult exam - Primary   We discussed age appropriate health related issues, including available/recomended screening tests and vaccinations - declined. Labs were ordered to be later reviewed . All questions were answered. We discussed one or more of the following - seat belt use, use of sunscreen/sun exposure exercise, safe sex, fall risk reduction, second hand smoke exposure, firearm use and storage, seat belt use, a need for adhering to healthy diet and exercise. Labs were ordered.  All questions were answered. cardiac CT scan for calcium  scoring offered 2020. Total Agatston calcium  CT Score: 727 - 2024 cologuard (-) 2024      Relevant Orders  TSH   Urinalysis   CBC with Differential/Platelet   Lipid panel   PSA   Comprehensive metabolic panel with GFR      Meds ordered this encounter  Medications   tadalafil  (CIALIS ) 5 MG tablet    Sig: Take 1 tablet (5 mg total) by mouth daily.    Dispense:  30 tablet    Refill:  11      Follow-up: Return in about 1 year (around 12/22/2024) for Wellness Exam.  Marolyn Noel, MD

## 2023-12-23 NOTE — Assessment & Plan Note (Signed)
 04/2022 right middle lobe haziness on CT.  Will get a follow-up CT

## 2023-12-25 ENCOUNTER — Telehealth: Payer: Self-pay

## 2023-12-25 ENCOUNTER — Other Ambulatory Visit (HOSPITAL_COMMUNITY): Payer: Self-pay

## 2023-12-25 NOTE — Telephone Encounter (Signed)
 Pharmacy Patient Advocate Encounter   Received notification from CoverMyMeds that prior authorization for Tadalafil  5MG  tablets is required/requested.   Insurance verification completed.   The patient is insured through [facility].   Per test claim: PA required; PA submitted to above mentioned insurance via Latent Key/confirmation #/EOC [facility] Status is pending

## 2023-12-25 NOTE — Telephone Encounter (Signed)
 Pharmacy Patient Advocate Encounter  Received notification from [facility] that Prior Authorization for  Tadalafil  5MG  tablets  has been APPROVED from 12/25/23 to 06/24/24   PA #/Case ID/Reference #: [id number]

## 2023-12-29 ENCOUNTER — Ambulatory Visit: Payer: Self-pay | Admitting: Internal Medicine

## 2024-01-05 ENCOUNTER — Ambulatory Visit
Admission: RE | Admit: 2024-01-05 | Discharge: 2024-01-05 | Disposition: A | Source: Ambulatory Visit | Attending: Internal Medicine | Admitting: Internal Medicine

## 2024-01-05 DIAGNOSIS — R9389 Abnormal findings on diagnostic imaging of other specified body structures: Secondary | ICD-10-CM

## 2024-05-04 ENCOUNTER — Ambulatory Visit: Payer: Medicare HMO
